# Patient Record
Sex: Female | Born: 1945 | Race: White | Hispanic: No | Marital: Married | Smoking: Former smoker
Health system: Southern US, Community
[De-identification: ages and names within clinical notes are randomized; demographics above are authoritative.]

## PROBLEM LIST (undated history)

## (undated) DIAGNOSIS — E7800 Pure hypercholesterolemia, unspecified: Secondary | ICD-10-CM

## (undated) DIAGNOSIS — D649 Anemia, unspecified: Secondary | ICD-10-CM

## (undated) DIAGNOSIS — M1990 Unspecified osteoarthritis, unspecified site: Secondary | ICD-10-CM

## (undated) DIAGNOSIS — I10 Essential (primary) hypertension: Secondary | ICD-10-CM

## (undated) DIAGNOSIS — IMO0001 Reserved for inherently not codable concepts without codable children: Secondary | ICD-10-CM

## (undated) DIAGNOSIS — Z5189 Encounter for other specified aftercare: Secondary | ICD-10-CM

## (undated) DIAGNOSIS — E079 Disorder of thyroid, unspecified: Secondary | ICD-10-CM

## (undated) HISTORY — DX: Encounter for other specified aftercare: Z51.89

## (undated) HISTORY — DX: Unspecified osteoarthritis, unspecified site: M19.90

## (undated) HISTORY — DX: Pure hypercholesterolemia, unspecified: E78.00

## (undated) HISTORY — DX: Reserved for inherently not codable concepts without codable children: IMO0001

## (undated) HISTORY — DX: Disorder of thyroid, unspecified: E07.9

## (undated) HISTORY — DX: Anemia, unspecified: D64.9

## (undated) HISTORY — PX: BREAST BIOPSY: SHX20

## (undated) HISTORY — DX: Essential (primary) hypertension: I10

---

## 2000-01-07 ENCOUNTER — Other Ambulatory Visit: Admission: RE | Admit: 2000-01-07 | Discharge: 2000-01-07 | Payer: Self-pay | Admitting: Endocrinology

## 2001-01-18 ENCOUNTER — Other Ambulatory Visit: Admission: RE | Admit: 2001-01-18 | Discharge: 2001-01-18 | Payer: Self-pay | Admitting: Endocrinology

## 2002-03-10 ENCOUNTER — Other Ambulatory Visit: Admission: RE | Admit: 2002-03-10 | Discharge: 2002-03-10 | Payer: Self-pay | Admitting: Diagnostic Radiology

## 2002-05-22 ENCOUNTER — Encounter: Payer: Self-pay | Admitting: *Deleted

## 2002-05-22 HISTORY — PX: THYROIDECTOMY, PARTIAL: SHX18

## 2002-05-24 ENCOUNTER — Inpatient Hospital Stay (HOSPITAL_COMMUNITY): Admission: AD | Admit: 2002-05-24 | Discharge: 2002-05-26 | Payer: Self-pay | Admitting: *Deleted

## 2002-05-24 ENCOUNTER — Encounter (INDEPENDENT_AMBULATORY_CARE_PROVIDER_SITE_OTHER): Payer: Self-pay | Admitting: *Deleted

## 2004-07-02 ENCOUNTER — Ambulatory Visit: Payer: Self-pay | Admitting: Endocrinology

## 2004-07-08 ENCOUNTER — Ambulatory Visit: Payer: Self-pay | Admitting: Endocrinology

## 2005-07-03 ENCOUNTER — Ambulatory Visit: Payer: Self-pay | Admitting: Endocrinology

## 2005-07-10 ENCOUNTER — Ambulatory Visit: Payer: Self-pay | Admitting: Endocrinology

## 2005-07-28 ENCOUNTER — Ambulatory Visit: Payer: Self-pay | Admitting: Family Medicine

## 2006-07-12 ENCOUNTER — Ambulatory Visit: Payer: Self-pay | Admitting: Endocrinology

## 2006-07-12 LAB — CONVERTED CEMR LAB
ALT: 15 units/L (ref 0–40)
AST: 19 units/L (ref 0–37)
Albumin: 3.9 g/dL (ref 3.5–5.2)
Alkaline Phosphatase: 46 units/L (ref 39–117)
BUN: 18 mg/dL (ref 6–23)
Basophils Absolute: 0 10*3/uL (ref 0.0–0.1)
Basophils Relative: 0.4 % (ref 0.0–1.0)
Bilirubin Urine: NEGATIVE
CO2: 29 meq/L (ref 19–32)
Calcium: 9.3 mg/dL (ref 8.4–10.5)
Chloride: 103 meq/L (ref 96–112)
Cholesterol: 214 mg/dL (ref 0–200)
Creatinine, Ser: 0.9 mg/dL (ref 0.4–1.2)
Crystals: NEGATIVE
Direct LDL: 117.8 mg/dL
Eosinophils Relative: 1.2 % (ref 0.0–5.0)
GFR calc Af Amer: 82 mL/min
GFR calc non Af Amer: 68 mL/min
Glucose, Bld: 114 mg/dL — ABNORMAL HIGH (ref 70–99)
HCT: 36.9 % (ref 36.0–46.0)
HDL: 82.5 mg/dL (ref >39.0)
Hemoglobin, Urine: NEGATIVE
Hemoglobin: 12.4 g/dL (ref 12.0–15.0)
Ketones, ur: NEGATIVE mg/dL
Lymphocytes Relative: 20.7 % (ref 12.0–46.0)
MCHC: 33.7 g/dL (ref 30.0–36.0)
MCV: 94.6 fL (ref 78.0–100.0)
Monocytes Absolute: 0.4 10*3/uL (ref 0.2–0.7)
Monocytes Relative: 6.4 % (ref 3.0–11.0)
Mucus, UA: NEGATIVE
Neutro Abs: 4.7 10*3/uL (ref 1.4–7.7)
Neutrophils Relative %: 71.3 % (ref 43.0–77.0)
Nitrite: NEGATIVE
Platelets: 339 10*3/uL (ref 150–400)
Potassium: 4.1 meq/L (ref 3.5–5.1)
RBC / HPF: NONE SEEN
RBC: 3.9 M/uL (ref 3.87–5.11)
RDW: 13.5 % (ref 11.5–14.6)
Sodium: 139 meq/L (ref 135–145)
Specific Gravity, Urine: 1.025 (ref 1.000–1.03)
TSH: 0.91 microintl units/mL (ref 0.35–5.50)
Total Bilirubin: 0.6 mg/dL (ref 0.3–1.2)
Total CHOL/HDL Ratio: 2.6
Total Protein, Urine: NEGATIVE mg/dL
Total Protein: 7 g/dL (ref 6.0–8.3)
Triglycerides: 79 mg/dL (ref 0–149)
Urine Glucose: NEGATIVE mg/dL
Urobilinogen, UA: 0.2 (ref 0.0–1.0)
VLDL: 16 mg/dL (ref 0–40)
WBC: 6.6 10*3/uL (ref 4.5–10.5)
pH: 5.5 (ref 5.0–8.0)

## 2006-07-15 ENCOUNTER — Ambulatory Visit: Payer: Self-pay | Admitting: Endocrinology

## 2007-02-19 ENCOUNTER — Encounter: Payer: Self-pay | Admitting: Endocrinology

## 2007-02-19 DIAGNOSIS — J309 Allergic rhinitis, unspecified: Secondary | ICD-10-CM | POA: Insufficient documentation

## 2007-02-19 DIAGNOSIS — I10 Essential (primary) hypertension: Secondary | ICD-10-CM | POA: Insufficient documentation

## 2007-02-19 DIAGNOSIS — M1990 Unspecified osteoarthritis, unspecified site: Secondary | ICD-10-CM

## 2007-07-21 ENCOUNTER — Telehealth: Payer: Self-pay | Admitting: Endocrinology

## 2007-07-29 ENCOUNTER — Ambulatory Visit: Payer: Self-pay | Admitting: Endocrinology

## 2007-07-29 LAB — CONVERTED CEMR LAB
ALT: 17 units/L (ref 0–35)
AST: 22 units/L (ref 0–37)
Albumin: 4 g/dL (ref 3.5–5.2)
Alkaline Phosphatase: 42 units/L (ref 39–117)
BUN: 11 mg/dL (ref 6–23)
Basophils Absolute: 0 10*3/uL (ref 0.0–0.1)
Basophils Relative: 0.5 % (ref 0.0–1.0)
Bilirubin Urine: NEGATIVE
Bilirubin, Direct: 0.1 mg/dL (ref 0.0–0.3)
CO2: 32 meq/L (ref 19–32)
Calcium: 9.6 mg/dL (ref 8.4–10.5)
Chloride: 102 meq/L (ref 96–112)
Cholesterol: 184 mg/dL (ref 0–200)
Creatinine, Ser: 0.7 mg/dL (ref 0.4–1.2)
Eosinophils Absolute: 0.1 10*3/uL (ref 0.0–0.6)
Eosinophils Relative: 1.1 % (ref 0.0–5.0)
GFR calc Af Amer: 109 mL/min
GFR calc non Af Amer: 90 mL/min
Glucose, Bld: 116 mg/dL — ABNORMAL HIGH (ref 70–99)
HCT: 35.3 % — ABNORMAL LOW (ref 36.0–46.0)
HDL: 84.2 mg/dL (ref >39.0)
Hemoglobin, Urine: NEGATIVE
Hemoglobin: 11.7 g/dL — ABNORMAL LOW (ref 12.0–15.0)
Hgb A1c MFr Bld: 6 % (ref 4.6–6.0)
Ketones, ur: NEGATIVE mg/dL
LDL Cholesterol: 86 mg/dL (ref 0–99)
Leukocytes, UA: NEGATIVE
Lymphocytes Relative: 21.5 % (ref 12.0–46.0)
MCHC: 33.3 g/dL (ref 30.0–36.0)
MCV: 94.8 fL (ref 78.0–100.0)
Monocytes Absolute: 0.5 10*3/uL (ref 0.2–0.7)
Monocytes Relative: 8 % (ref 3.0–11.0)
Neutro Abs: 4 10*3/uL (ref 1.4–7.7)
Neutrophils Relative %: 68.9 % (ref 43.0–77.0)
Nitrite: NEGATIVE
Platelets: 314 10*3/uL (ref 150–400)
Potassium: 3.7 meq/L (ref 3.5–5.1)
RBC: 3.72 M/uL — ABNORMAL LOW (ref 3.87–5.11)
RDW: 13.9 % (ref 11.5–14.6)
Sed Rate: 22 mm/hr (ref 0–25)
Sodium: 141 meq/L (ref 135–145)
Specific Gravity, Urine: 1.02 (ref 1.000–1.03)
TSH: 1.07 microintl units/mL (ref 0.35–5.50)
Total Bilirubin: 0.6 mg/dL (ref 0.3–1.2)
Total CHOL/HDL Ratio: 2.2
Total Protein, Urine: NEGATIVE mg/dL
Total Protein: 6.8 g/dL (ref 6.0–8.3)
Triglycerides: 69 mg/dL (ref 0–149)
Urine Glucose: NEGATIVE mg/dL
Urobilinogen, UA: 0.2 (ref 0.0–1.0)
VLDL: 14 mg/dL (ref 0–40)
WBC: 5.9 10*3/uL (ref 4.5–10.5)
pH: 6 (ref 5.0–8.0)

## 2007-08-03 ENCOUNTER — Ambulatory Visit: Payer: Self-pay | Admitting: Endocrinology

## 2007-08-03 DIAGNOSIS — R9431 Abnormal electrocardiogram [ECG] [EKG]: Secondary | ICD-10-CM

## 2007-08-03 DIAGNOSIS — D649 Anemia, unspecified: Secondary | ICD-10-CM

## 2007-08-09 ENCOUNTER — Telehealth (INDEPENDENT_AMBULATORY_CARE_PROVIDER_SITE_OTHER): Payer: Self-pay | Admitting: *Deleted

## 2007-08-15 ENCOUNTER — Encounter: Payer: Self-pay | Admitting: Endocrinology

## 2007-08-15 ENCOUNTER — Ambulatory Visit: Payer: Self-pay

## 2007-08-15 ENCOUNTER — Telehealth (INDEPENDENT_AMBULATORY_CARE_PROVIDER_SITE_OTHER): Payer: Self-pay | Admitting: *Deleted

## 2008-01-12 ENCOUNTER — Encounter: Payer: Self-pay | Admitting: Endocrinology

## 2008-05-22 HISTORY — PX: OTHER SURGICAL HISTORY: SHX169

## 2008-07-30 ENCOUNTER — Ambulatory Visit: Payer: Self-pay | Admitting: Endocrinology

## 2008-08-01 LAB — CONVERTED CEMR LAB
ALT: 14 units/L (ref 0–35)
AST: 21 units/L (ref 0–37)
Albumin: 4 g/dL (ref 3.5–5.2)
Alkaline Phosphatase: 42 units/L (ref 39–117)
BUN: 15 mg/dL (ref 6–23)
Basophils Absolute: 0 10*3/uL (ref 0.0–0.1)
Basophils Relative: 0.6 % (ref 0.0–3.0)
Bilirubin Urine: NEGATIVE
Bilirubin, Direct: 0.1 mg/dL (ref 0.0–0.3)
CO2: 30 meq/L (ref 19–32)
Calcium: 9.2 mg/dL (ref 8.4–10.5)
Chloride: 102 meq/L (ref 96–112)
Cholesterol: 179 mg/dL (ref 0–200)
Creatinine, Ser: 0.7 mg/dL (ref 0.4–1.2)
Eosinophils Absolute: 0 10*3/uL (ref 0.0–0.7)
Eosinophils Relative: 0.7 % (ref 0.0–5.0)
GFR calc Af Amer: 109 mL/min
GFR calc non Af Amer: 90 mL/min
Glucose, Bld: 110 mg/dL — ABNORMAL HIGH (ref 70–99)
HCT: 35.5 % — ABNORMAL LOW (ref 36.0–46.0)
HDL: 89.2 mg/dL (ref >39.0)
Hemoglobin, Urine: NEGATIVE
Hemoglobin: 12.2 g/dL (ref 12.0–15.0)
Ketones, ur: NEGATIVE mg/dL
LDL Cholesterol: 79 mg/dL (ref 0–99)
Leukocytes, UA: NEGATIVE
Lymphocytes Relative: 16.8 % (ref 12.0–46.0)
MCHC: 34.5 g/dL (ref 30.0–36.0)
MCV: 94.9 fL (ref 78.0–100.0)
Monocytes Absolute: 0.5 10*3/uL (ref 0.1–1.0)
Monocytes Relative: 7 % (ref 3.0–12.0)
Neutro Abs: 5.1 10*3/uL (ref 1.4–7.7)
Neutrophils Relative %: 74.9 % (ref 43.0–77.0)
Nitrite: NEGATIVE
Platelets: 282 10*3/uL (ref 150–400)
Potassium: 3.6 meq/L (ref 3.5–5.1)
RBC: 3.74 M/uL — ABNORMAL LOW (ref 3.87–5.11)
RDW: 14.9 % — ABNORMAL HIGH (ref 11.5–14.6)
Sodium: 140 meq/L (ref 135–145)
Specific Gravity, Urine: 1.025 (ref 1.000–1.03)
TSH: 1.03 microintl units/mL (ref 0.35–5.50)
Total Bilirubin: 0.6 mg/dL (ref 0.3–1.2)
Total CHOL/HDL Ratio: 2
Total Protein, Urine: NEGATIVE mg/dL
Total Protein: 6.7 g/dL (ref 6.0–8.3)
Triglycerides: 54 mg/dL (ref 0–149)
Urine Glucose: NEGATIVE mg/dL
Urobilinogen, UA: 0.2 (ref 0.0–1.0)
VLDL: 11 mg/dL (ref 0–40)
WBC: 6.7 10*3/uL (ref 4.5–10.5)
pH: 6 (ref 5.0–8.0)

## 2008-08-03 ENCOUNTER — Ambulatory Visit: Payer: Self-pay | Admitting: Endocrinology

## 2008-08-03 DIAGNOSIS — Z780 Asymptomatic menopausal state: Secondary | ICD-10-CM

## 2008-08-09 ENCOUNTER — Encounter: Payer: Self-pay | Admitting: Endocrinology

## 2008-08-09 ENCOUNTER — Ambulatory Visit: Payer: Self-pay | Admitting: Family Medicine

## 2008-08-24 ENCOUNTER — Telehealth: Payer: Self-pay | Admitting: Endocrinology

## 2009-05-09 ENCOUNTER — Encounter: Payer: Self-pay | Admitting: Endocrinology

## 2009-07-30 ENCOUNTER — Ambulatory Visit: Payer: Self-pay | Admitting: Endocrinology

## 2009-07-31 LAB — CONVERTED CEMR LAB
ALT: 14 units/L (ref 0–35)
AST: 19 units/L (ref 0–37)
Albumin: 4 g/dL (ref 3.5–5.2)
Alkaline Phosphatase: 41 units/L (ref 39–117)
BUN: 17 mg/dL (ref 6–23)
Basophils Absolute: 0 10*3/uL (ref 0.0–0.1)
Basophils Relative: 0.6 % (ref 0.0–3.0)
Bilirubin, Direct: 0.1 mg/dL (ref 0.0–0.3)
CO2: 29 meq/L (ref 19–32)
Calcium: 9 mg/dL (ref 8.4–10.5)
Chloride: 109 meq/L (ref 96–112)
Cholesterol: 199 mg/dL (ref 0–200)
Creatinine, Ser: 0.7 mg/dL (ref 0.4–1.2)
Eosinophils Absolute: 0.1 10*3/uL (ref 0.0–0.7)
Eosinophils Relative: 1.5 % (ref 0.0–5.0)
GFR calc non Af Amer: 89.32 mL/min (ref >60)
Glucose, Bld: 102 mg/dL — ABNORMAL HIGH (ref 70–99)
HCT: 34.9 % — ABNORMAL LOW (ref 36.0–46.0)
HDL: 96.5 mg/dL (ref >39.00)
Hemoglobin, Urine: NEGATIVE
Hemoglobin: 11.6 g/dL — ABNORMAL LOW (ref 12.0–15.0)
Ketones, ur: NEGATIVE mg/dL
LDL Cholesterol: 88 mg/dL (ref 0–99)
Leukocytes, UA: NEGATIVE
Lymphocytes Relative: 31.2 % (ref 12.0–46.0)
Lymphs Abs: 1.4 10*3/uL (ref 0.7–4.0)
MCHC: 33.3 g/dL (ref 30.0–36.0)
MCV: 98.5 fL (ref 78.0–100.0)
Monocytes Absolute: 0.3 10*3/uL (ref 0.1–1.0)
Monocytes Relative: 7.1 % (ref 3.0–12.0)
Neutro Abs: 2.8 10*3/uL (ref 1.4–7.7)
Neutrophils Relative %: 59.6 % (ref 43.0–77.0)
Nitrite: NEGATIVE
Platelets: 251 10*3/uL (ref 150.0–400.0)
Potassium: 3.8 meq/L (ref 3.5–5.1)
RBC: 3.55 M/uL — ABNORMAL LOW (ref 3.87–5.11)
RDW: 13.8 % (ref 11.5–14.6)
Sodium: 145 meq/L (ref 135–145)
Specific Gravity, Urine: >=1.03 (ref 1.000–1.030)
TSH: 0.77 microintl units/mL (ref 0.35–5.50)
Total Bilirubin: 0.6 mg/dL (ref 0.3–1.2)
Total CHOL/HDL Ratio: 2
Total Protein, Urine: NEGATIVE mg/dL
Total Protein: 6.6 g/dL (ref 6.0–8.3)
Triglycerides: 72 mg/dL (ref 0.0–149.0)
Urine Glucose: NEGATIVE mg/dL
Urobilinogen, UA: 0.2 (ref 0.0–1.0)
VLDL: 14.4 mg/dL (ref 0.0–40.0)
WBC: 4.6 10*3/uL (ref 4.5–10.5)
pH: 6 (ref 5.0–8.0)

## 2009-08-05 ENCOUNTER — Ambulatory Visit: Payer: Self-pay | Admitting: Endocrinology

## 2009-09-25 ENCOUNTER — Telehealth: Payer: Self-pay | Admitting: Endocrinology

## 2009-12-19 ENCOUNTER — Encounter (INDEPENDENT_AMBULATORY_CARE_PROVIDER_SITE_OTHER): Payer: Self-pay | Admitting: *Deleted

## 2010-07-20 LAB — CONVERTED CEMR LAB
Basophils Absolute: 0 10*3/uL (ref 0.0–0.1)
Basophils Relative: 0.3 % (ref 0.0–1.0)
Creatinine,U: 27.4 mg/dL
Eosinophils Absolute: 0 10*3/uL (ref 0.0–0.6)
Eosinophils Relative: 0.7 % (ref 0.0–5.0)
Folate: >20 ng/mL
HCT: 35.8 % — ABNORMAL LOW (ref 36.0–46.0)
Hemoglobin: 12.2 g/dL (ref 12.0–15.0)
Hgb A1c MFr Bld: 6 % (ref 4.6–6.0)
Iron: 67 ug/dL (ref 42–145)
Lymphocytes Relative: 19 % (ref 12.0–46.0)
MCHC: 34 g/dL (ref 30.0–36.0)
MCV: 94.7 fL (ref 78.0–100.0)
Microalb Creat Ratio: 29.2 mg/g (ref 0.0–30.0)
Microalb, Ur: 0.8 mg/dL (ref 0.0–1.9)
Monocytes Absolute: 0.5 10*3/uL (ref 0.2–0.7)
Monocytes Relative: 7.7 % (ref 3.0–11.0)
Neutro Abs: 5 10*3/uL (ref 1.4–7.7)
Neutrophils Relative %: 72.3 % (ref 43.0–77.0)
Platelets: 319 10*3/uL (ref 150–400)
RBC: 3.78 M/uL — ABNORMAL LOW (ref 3.87–5.11)
RDW: 14.3 % (ref 11.5–14.6)
Saturation Ratios: 16.1 % — ABNORMAL LOW (ref 20.0–50.0)
Transferrin: 296.8 mg/dL (ref >=212.0)
Vitamin B-12: 369 pg/mL (ref 211–911)
WBC: 6.8 10*3/uL (ref 4.5–10.5)

## 2010-07-22 NOTE — Assessment & Plan Note (Signed)
Summary: PHYSICAL---STC   Vital Signs:  Patient profile:   65 year old female Height:      63.5 inches (161.29 cm) Weight:      157.13 pounds (71.42 kg) BMI:     27.50 O2 Sat:      97 % on Room air Temp:     97.3 degrees F (36.28 degrees C) oral Pulse rate:   74 / minute BP sitting:   130 / 72  (left arm) Cuff size:   regular  Vitals Entered By: Josph Macho CMA (August 05, 2009 11:10 AM)  O2 Flow:  Room air CC: Physical/ CF Is Patient Diabetic? Yes   CC:  Physical/ CF.  History of Present Illness: here for regular wellness examination.  she's feeling pretty well in general, and does not smoke.   Current Medications (verified): 1)  Evista 60 Mg  Tabs (Raloxifene Hcl) .... Take 1 By Mouth Qd 2)  Actos 15 Mg  Tabs (Pioglitazone Hcl) .... Take 1 By Mouth Qd 3)  Zestril 40 Mg  Tabs (Lisinopril) .... Take 1 By Mouth Qd 4)  Levoxyl 75 Mcg  Tabs (Levothyroxine Sodium) .... Takem 1 By Mouth Qd 5)  Lovastatin 40 Mg  Tabs (Lovastatin) .... Take 2 By Mouth Qd 6)  Coreg 6.25 Mg  Tabs (Carvedilol) .... Bid 7)  Hyzaar 100-25 Mg Tabs (Losartan Potassium-Hctz) .... Qd 8)  Amlodipine Besylate 2.5 Mg Tabs (Amlodipine Besylate) .... Qd  Allergies (verified): 1)  ! Erythromycin 2)  ! Demerol  Past History:  Past Medical History: Last updated: 08/03/2008 Menopause Hyperlipidemia Leukopenia NONSPECIFIC ABNORMAL ELECTROCARDIOGRAM (ICD-794.31) UNSPECIFIED ANEMIA (ICD-285.9) ROUTINE GENERAL MEDICAL EXAM@HEALTH  CARE FACL (ICD-V70.0) OSTEOARTHRITIS (ICD-715.90) HYPERTENSION (ICD-401.9) DIABETES MELLITUS, TYPE II (ICD-250.00) ALLERGIC RHINITIS (ICD-477.9)  Family History: Reviewed history from 08/03/2007 and no changes required. no cancer  Social History: Reviewed history from 08/03/2007 and no changes required. 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         she has lost a few lbs, due to her efforts  Physical Exam  General:  normal appearance.   Head:  head: no deformity eyes: no periorbital swelling, no proptosis external nose and ears are normal mouth: no lesion seen Neck:  a healed scar is present.  i do not appreciate a nodule in the thyroid or elsewhere in the neck  Breasts:  sees gyn  Lungs:  Clear to auscultation bilaterally. Normal respiratory effort.  Heart:  Regular rate and rhythm without murmurs or gallops noted. Normal S1,S2.   Abdomen:  abdomen is soft, nontender.  no hepatosplenomegaly.   not distended.  no hernia  Rectal:  sees gyn  Genitalia:  sees gyn  Msk:  muscle bulk and strength are grossly normal.  no obvious joint swelling.  gait is normal and steady  Pulses:  dorsalis pedis intact bilat.  no carotid bruit  Extremities:  no deformity.  no ulcer on the feet.  feet are of normal color and temp.  no edema  Neurologic:  sensation is intact to touch on the feet  Skin:  normal texture and temp.  no rash, except for mild seborrhea on the face.  not diaphoretic  Cervical Nodes:  No significant adenopathy.  Psych:  Alert and cooperative; normal mood and affect; normal attention span and concentration.  Impression & Recommendations:  Problem # 1:  ROUTINE GENERAL MEDICAL EXAM@HEALTH  CARE FACL (ICD-V70.0)  Medications Added to Medication List This Visit: 1)  Screening Mammography   Other Orders: EKG w/ Interpretation (93000) Tdap => 37yrs IM ([address]) Pneumococcal Vaccine (60[address]) Admin 1st Vaccine (09811) Admin of Any Addtl Vaccine (91478) Gastroenterology Referral (GI) Est. Patient 40-64 years [phone]  Preventive Care Screening     sees gyn   Patient Instructions: 1)  wash your face once daily with selsun blue shampoo.  call if no help, to consider  antiobiotic gel. 2)  Please schedule a follow-up appointment in 6 months. Prescriptions: SCREENING MAMMOGRAPHY   #0 x 0   Entered and Authorized by:   Minus Breeding MD   Signed by:   Minus Breeding MD on 08/05/2009   Method used:   Print then Give to Patient   RxID:   [id number] HYZAAR 100-25 MG TABS (LOSARTAN POTASSIUM-HCTZ) qd  #30 x 11   Entered and Authorized by:   Minus Breeding MD   Signed by:   Minus Breeding MD on 08/05/2009   Method used:   Electronically to        [facility] [phone]* (retail)       [address] [place], [place]  [address]       Ph: [phone]       Fax: [phone]   RxID:   [id number] COREG 6.25 MG  TABS (CARVEDILOL) bid  #60 x 11   Entered and Authorized by:   Minus Breeding MD   Signed by:   Minus Breeding MD on 08/05/2009   Method used:   Electronically to        [facility] [phone]* (retail)       [address] [place], [place]  [address]       Ph: [phone]       Fax: [phone]   RxID:   [id number] AMLODIPINE BESYLATE 2.5 MG TABS (AMLODIPINE BESYLATE) qd  #30 x 11   Entered and Authorized by:   Minus Breeding MD   Signed by:   Minus Breeding MD on 08/05/2009   Method used:   Electronically to        [facility] [phone]* (retail)       [address] [place], [place]  [address]       Ph: [phone]       Fax: [phone]   RxID:   [id number] LOVASTATIN 40 MG  TABS (LOVASTATIN) take 2 by mouth qd  #60 x 11   Entered and Authorized by:   Minus Breeding MD   Signed by:   Minus Breeding MD on 08/05/2009   Method used:   Electronically to        [facility] [phone]* (retail)       [address] [place], [place]  [address]       Ph: [phone]       Fax: [phone]   RxID:   [id number] LEVOXYL 75 MCG  TABS (LEVOTHYROXINE SODIUM) takem 1 by mouth qd  #30 x 11   Entered and Authorized by:   Minus Breeding MD   Signed by:   Minus Breeding MD on 08/05/2009  Method used:    Electronically to        [facility] [phone]* (retail)       [address] [place], [place]  [address]       Ph: [phone]       Fax: [phone]   RxID:   [id number] ZESTRIL 40 MG  TABS (LISINOPRIL) take 1 by mouth qd  #30 x 11   Entered and Authorized by:   Minus Breeding MD   Signed by:   Minus Breeding MD on 08/05/2009   Method used:   Electronically to        [facility] [phone]* (retail)       [address] [place], [place]  [address]       Ph: [phone]       Fax: [phone]   RxID:   [id number] ACTOS 15 MG  TABS (PIOGLITAZONE HCL) take 1 by mouth qd  #30 x 11   Entered and Authorized by:   Minus Breeding MD   Signed by:   Minus Breeding MD on 08/05/2009   Method used:   Electronically to        [facility] [phone]* (retail)       [address] [place], [place]  [address]       Ph: [phone]       Fax: [phone]   RxID:   [id number] EVISTA 60 MG  TABS (RALOXIFENE HCL) take 1 by mouth qd  #30 x 11   Entered and Authorized by:   Minus Breeding MD   Signed by:   Minus Breeding MD on 08/05/2009   Method used:   Electronically to        [facility] [phone]* (retail)       [address] [place], [place]  [address]       Ph: [phone]       Fax: [phone]   RxID:   [id number]    Preventive Care Screening     sees gyn    Immunizations Administered:  Tetanus Vaccine:    Vaccine Type: Tdap    Site: left deltoid    Mfr: Sanofi Pasteur    Dose: 0.5 ml    Route: IM    Given by: Josph Macho RMA    Exp. Date: 05/07/2011    Lot #: [id number]    VIS given: 05/10/07 version given August 05, 2009.  Pneumonia Vaccine:    Vaccine Type: Pneumovax    Site: right deltoid    Mfr: Merck    Dose: 0.5 ml    Route: IM    Given by: Josph Macho RMA    Exp. Date: 07/18/2010    Lot #: [id number]    VIS given: 01/18/96 version given August 05, 2009.    Bone Density  Procedure date:   08/06/2005  Findings:      08/06/2005- Overall the patient's bone density test results were normal. Lumbar spine was normal Proximal femur was normal

## 2010-07-22 NOTE — Letter (Signed)
Summary: [facility] Referral (unable to schedule) Notification  [facility]  [address] [place], [place] [address]   Phone: [phone]  Fax: [phone]      December 19, 2009 [name] September 05, 1945 MRN: [id number]   [name] [address] [place], [place]  [address]   Dear Dr. Everardo All:   Thank you for your kind referral of the above patient. We have attempted to schedule the recommended Colonoscopy but have been unable to schedule because:  _X_ The patient was not available by phone and/or has not returned our calls.  __ The patient declined to schedule the procedure at this time.  We appreciate the referral and hope that we will have the opportunity to treat this patient in the future.    Sincerely,   [facility]  Vania Rea. Jarold Motto M.D. Hedwig Morton. Juanda Chance M.D. Venita Lick. Russella Dar M.D. Wilhemina Bonito. Marina Goodell M.D. Barbette Hair. Arlyce Dice M.D. Iva Boop M.D. Cheron Every.D.

## 2010-07-22 NOTE — Progress Notes (Signed)
Summary: Rx requesting   Phone Note Call from Patient Call back at Home Phone [phone]   Caller: Patient Summary of Call: pt called stating that she is now enrolled in [facility]. Pt is requesting Rx for all medications to go to [facility] mail order pharmacy Initial call taken by: Margaret Pyle, CMA,  September 25, 2009 1:01 PM    Prescriptions: AMLODIPINE BESYLATE 2.5 MG TABS (AMLODIPINE BESYLATE) qd  #90 x 3   Entered by:   Margaret Pyle, CMA   Authorized by:   Minus Breeding MD   Signed by:   Margaret Pyle, CMA on 09/25/2009   Method used:   Electronically to        [facility]* (mail-order)       [address]       [place], [place]  [address]       Ph: [phone]       Fax: [phone]   RxID:   [id number] HYZAAR 100-25 MG TABS (LOSARTAN POTASSIUM-HCTZ) qd  #90 x 3   Entered by:   Margaret Pyle, CMA   Authorized by:   Minus Breeding MD   Signed by:   Margaret Pyle, CMA on 09/25/2009   Method used:   Electronically to        [facility]* (mail-order)       [address] [place]  [address]       Ph: [phone]       Fax: [phone]   RxID:   [id number] COREG 6.25 MG  TABS (CARVEDILOL) bid  #180 x 3   Entered by:   Margaret Pyle, CMA   Authorized by:   Minus Breeding MD   Signed by:   Margaret Pyle, CMA on 09/25/2009   Method used:   Electronically to        [facility]* (mail-order)       [address]       [place], [place]  [address]       Ph: [phone]       Fax: [phone]   RxID:   [id number] LOVASTATIN 40 MG  TABS (LOVASTATIN) take 2 by mouth qd  #180 x 3   Entered by:   Margaret Pyle, CMA   Authorized by:   Minus Breeding MD   Signed by:   Margaret Pyle, CMA on 09/25/2009   Method used:   Electronically to        [facility]* (mail-order)     [address]       [place], [place]  [address]       Ph: [phone]       Fax: [phone]   RxID:   [id number] LEVOXYL 75 MCG  TABS (LEVOTHYROXINE SODIUM) takem 1 by mouth qd  #90 x 3   Entered by:   Margaret Pyle, CMA   Authorized by:   Minus Breeding MD   Signed by:   Margaret Pyle, CMA on 09/25/2009   Method used:   Electronically to        [facility]* (mail-order)       [address] [place] [place]  [address]       Ph: [phone]       Fax: [phone]   RxID:   [id number] ZESTRIL 40 MG  TABS (LISINOPRIL) take 1 by  mouth qd  #90 x 3   Entered by:   Margaret Pyle, CMA   Authorized by:   Minus Breeding MD   Signed by:   Margaret Pyle, CMA on 09/25/2009   Method used:   Electronically to        [facility]* (mail-order)       [address]       [place], [place]  [address]       Ph: [phone]       Fax: [phone]   RxID:   [id number] ACTOS 15 MG  TABS (PIOGLITAZONE HCL) take 1 by mouth qd  #90 x 3   Entered by:   Margaret Pyle, CMA   Authorized by:   Minus Breeding MD   Signed by:   Margaret Pyle, CMA on 09/25/2009   Method used:   Electronically to        [facility]* (mail-order)       [address]       [place], [place]  [address]       Ph: [phone]       Fax: [phone]   RxID:   [id number] EVISTA 60 MG  TABS (RALOXIFENE HCL) take 1 by mouth qd  #90 x 3   Entered by:   Margaret Pyle, CMA   Authorized by:   Minus Breeding MD   Signed by:   Margaret Pyle, CMA on 09/25/2009   Method used:   Electronically to        [facility]* (mail-order)       [address], [place]  [address]       Ph: [phone]       Fax: [phone]   RxID:   [id number]

## 2010-08-11 ENCOUNTER — Encounter (INDEPENDENT_AMBULATORY_CARE_PROVIDER_SITE_OTHER): Payer: Medicare Other | Admitting: Endocrinology

## 2010-08-11 ENCOUNTER — Encounter: Payer: Self-pay | Admitting: Endocrinology

## 2010-08-11 ENCOUNTER — Other Ambulatory Visit: Payer: Medicare Other

## 2010-08-11 ENCOUNTER — Encounter (INDEPENDENT_AMBULATORY_CARE_PROVIDER_SITE_OTHER): Payer: Self-pay | Admitting: *Deleted

## 2010-08-11 ENCOUNTER — Other Ambulatory Visit: Payer: Self-pay | Admitting: Endocrinology

## 2010-08-11 DIAGNOSIS — Z79899 Other long term (current) drug therapy: Secondary | ICD-10-CM

## 2010-08-11 DIAGNOSIS — Z0000 Encounter for general adult medical examination without abnormal findings: Secondary | ICD-10-CM

## 2010-08-11 DIAGNOSIS — E785 Hyperlipidemia, unspecified: Secondary | ICD-10-CM

## 2010-08-11 DIAGNOSIS — E119 Type 2 diabetes mellitus without complications: Secondary | ICD-10-CM

## 2010-08-11 DIAGNOSIS — I10 Essential (primary) hypertension: Secondary | ICD-10-CM

## 2010-08-11 DIAGNOSIS — E7800 Pure hypercholesterolemia, unspecified: Secondary | ICD-10-CM

## 2010-08-11 DIAGNOSIS — D649 Anemia, unspecified: Secondary | ICD-10-CM

## 2010-08-11 LAB — BASIC METABOLIC PANEL
Calcium: 9.4 mg/dL (ref 8.4–10.5)
Creatinine, Ser: 0.6 mg/dL (ref 0.4–1.2)
GFR: 100.54 mL/min (ref >60.00)
Sodium: 141 mEq/L (ref 135–145)

## 2010-08-11 LAB — CBC WITH DIFFERENTIAL/PLATELET
Basophils Absolute: 0 10*3/uL (ref 0.0–0.1)
Basophils Relative: 0.3 % (ref 0.0–3.0)
Hemoglobin: 12.6 g/dL (ref 12.0–15.0)
Lymphocytes Relative: 19.3 % (ref 12.0–46.0)
Monocytes Relative: 6.7 % (ref 3.0–12.0)
Neutro Abs: 5.1 10*3/uL (ref 1.4–7.7)
Neutrophils Relative %: 72.3 % (ref 43.0–77.0)
RBC: 3.72 Mil/uL — ABNORMAL LOW (ref 3.87–5.11)
RDW: 14 % (ref 11.5–14.6)

## 2010-08-11 LAB — URINALYSIS, ROUTINE W REFLEX MICROSCOPIC
Bilirubin Urine: NEGATIVE
Ketones, ur: NEGATIVE
Leukocytes, UA: NEGATIVE
Nitrite: NEGATIVE
Specific Gravity, Urine: 1.02 (ref 1.000–1.030)
Urobilinogen, UA: 0.2 (ref 0.0–1.0)
pH: 6 (ref 5.0–8.0)

## 2010-08-11 LAB — HEPATIC FUNCTION PANEL
AST: 19 U/L (ref 0–37)
Albumin: 4.2 g/dL (ref 3.5–5.2)
Alkaline Phosphatase: 45 U/L (ref 39–117)
Bilirubin, Direct: 0.1 mg/dL (ref 0.0–0.3)

## 2010-08-11 LAB — MICROALBUMIN / CREATININE URINE RATIO
Creatinine,U: 168.2 mg/dL
Microalb Creat Ratio: 0.6 mg/g (ref 0.0–30.0)

## 2010-08-11 LAB — LIPID PANEL
HDL: 98 mg/dL (ref >39.00)
Total CHOL/HDL Ratio: 2
Triglycerides: 61 mg/dL (ref 0.0–149.0)

## 2010-08-11 LAB — IBC PANEL
Saturation Ratios: 19.5 % — ABNORMAL LOW (ref 20.0–50.0)
Transferrin: 274.5 mg/dL (ref 212.0–360.0)

## 2010-08-11 LAB — LDL CHOLESTEROL, DIRECT: Direct LDL: 96.1 mg/dL

## 2010-08-15 ENCOUNTER — Encounter (INDEPENDENT_AMBULATORY_CARE_PROVIDER_SITE_OTHER): Payer: Self-pay | Admitting: *Deleted

## 2010-08-19 NOTE — Assessment & Plan Note (Signed)
Summary: yearly medicare physical-lb   Vital Signs:  Patient profile:   65 year old female Height:      63.5 inches (161.29 cm) Weight:      162.38 pounds (73.81 kg) BMI:     28.42 O2 Sat:      97 % on Room air Temp:     99.0 degrees F (37.22 degrees C) oral Pulse rate:   73 / minute Pulse rhythm:   regular BP sitting:   114 / 72  (left arm) Cuff size:   large  Vitals Entered By: Brenton Grills CMA (AAMA) (August 11, 2010 8:30 AM)  O2 Flow:  Room air CC: Medicare Wellness/aj Is Patient Diabetic? Yes Comments pt is due for mammogram and colonoscopy and may be due for pap   CC:  Medicare Wellness/aj.  History of Present Illness: pt is here for medicare welllness visit.  she denies memory loss and depression.  she says she is able to perform activities of daily living without assistance.  she has no limitations to physical activity.  she stopped actos 3 mos ago.   Current Medications (verified): 1)  Evista 60 Mg  Tabs (Raloxifene Hcl) .... Take 1 By Mouth Qd 2)  Actos 15 Mg  Tabs (Pioglitazone Hcl) .... Take 1 By Mouth Qd 3)  Zestril 40 Mg  Tabs (Lisinopril) .... Take 1 By Mouth Qd 4)  Levoxyl 75 Mcg  Tabs (Levothyroxine Sodium) .... Takem 1 By Mouth Qd 5)  Lovastatin 40 Mg  Tabs (Lovastatin) .... Take 2 By Mouth Qd 6)  Coreg 6.25 Mg  Tabs (Carvedilol) .... Bid 7)  Hyzaar 100-25 Mg Tabs (Losartan Potassium-Hctz) .... Qd 8)  Amlodipine Besylate 2.5 Mg Tabs (Amlodipine Besylate) .... Qd 9)  Screening Mammography  Allergies (verified): 1)  ! Erythromycin 2)  ! Demerol  Past History:  Past Medical History: Menopause Leukopenia HYPERCHOLESTEROLEMIA (ICD-272.0) LONG-TERM (CURRENT) USE OF OTHER MEDICATIONS (ICD-V58.69) ASYMPTOMATIC POSTMENOPAUSAL STATUS (ICD-V49.81) NONSPECIFIC ABNORMAL ELECTROCARDIOGRAM (ICD-794.31) UNSPECIFIED ANEMIA (ICD-285.9) ROUTINE GENERAL MEDICAL EXAM@HEALTH  CARE FACL (ICD-V70.0) OSTEOARTHRITIS (ICD-715.90) HYPERTENSION (ICD-401.9) DIABETES  MELLITUS, TYPE II (ICD-250.00) ALLERGIC RHINITIS (ICD-477.9)  ortho: dr curl ([facility]) gyn: not sure  Family History: Reviewed history from 08/03/2007 and no changes required. mother has colon cancer  Social History: Reviewed history from 08/05/2009 and no changes required. 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    Physical Exam  General:  normal appearance.   Eyes:  (sees opthal) Ears:  grossly normal hearing.   Lungs:  Clear to auscultation bilaterally. Normal respiratory effort.  Heart:  Regular rate and rhythm without murmurs or gallops noted. Normal S1,S2.   Msk:  pt easily and quickly performs "get-up-and-go" from a sitting position  Pulses:  dorsalis pedis intact bilat.  no carotid bruit  Extremities:  no deformity.  no ulcer on the feet.  feet are of normal color and temp.  no edema  Neurologic:  cn 2-12 grossly intact.   readily moves all 4's.   sensation is intact to touch on the feet Psych:  remembers 3/3 at 5 minutes.  excellent recall.  can easily read and write a sentence.  alert and oriented x 3.    Impression & Recommendations:  Problem # 1:  ROUTINE GENERAL MEDICAL EXAM@HEALTH  CARE FACL (ICD-V70.0)  Medications Added to Medication List  This Visit: 1)  Transderm-scop 1.5 Mg Pt72 (Scopolamine base) .Marland Kitchen.. 1 every 3 days  Other Orders: EKG w/ Interpretation (93[address]) Gastroenterology Referral (GI) TLB-Lipid Panel (80061-LIPID) TLB-BMP (Basic Metabolic Panel-BMET) (80048-METABOL) TLB-CBC Platelet - w/Differential (85025-CBCD) TLB-Hepatic/Liver Function Pnl (80076-HEPATIC) TLB-TSH (Thyroid Stimulating Hormone) (84443-TSH) TLB-A1C / Hgb A1C (Glycohemoglobin) (83036-A1C) TLB-Microalbumin/Creat Ratio, Urine (82043-MALB) TLB-Udip w/ Micro  (81001-URINE) TLB-IBC Pnl (Iron/FE;Transferrin) (83550-IBC) TLB-B12, Serum-Total ONLY (04540-J81) MC -Subsequent Annual Wellness Visit [phone])  Preventive Care Screening  Last Flu Shot:    Date:  03/24/2010    Results:  given      gyn is in [place], who does mammogram, pap, and dexa   Patient Instructions: 1)  please consider these measures for your health:  minimize alcohol.  do not use tobacco products.  have a colonoscopy at least every 10 years from age 52.  keep firearms safely stored.  always use seat belts.  have working smoke alarms in your home.  see an eye doctor and dentist regularly.  never drive under the influence of alcohol or drugs (including prescription drugs).  those with fair skin should take precautions against the sun. 2)  please let me know what your wishes would be, if artificial life support measures should become necessary.  it is critically important to prevent falling down (keep floor areas well-lit, dry, and free of loose objects) 3)  here is a list of medicare-covered preventive services 4)  you will be called with a day and time for an appointment for a colonoscopy. 5)  blood tests are being ordered for you today.  please call [phone] to hear your test results. 6)  Please schedule a follow-up appointment in 1 year. Prescriptions: AMLODIPINE BESYLATE 2.5 MG TABS (AMLODIPINE BESYLATE) qd  #90 x 3   Entered and Authorized by:   Minus Breeding MD   Signed by:   Minus Breeding MD on 08/11/2010   Method used:   Electronically to        [facility]* (mail-order)       [address]       [place], [place]  [address]       Ph: [phone]       Fax: [phone]   RxID:   [id number] HYZAAR 100-25 MG TABS (LOSARTAN POTASSIUM-HCTZ) qd  #90 x 3   Entered and Authorized by:   Minus Breeding MD   Signed by:   Minus Breeding MD on 08/11/2010   Method used:   Electronically to        [facility]* (mail-order)        [address]       [place], [place]  [address]       Ph: [phone]       Fax: [phone]   RxID:   [id number] COREG 6.25 MG  TABS (CARVEDILOL) bid  #180 x 3   Entered and Authorized by:   Minus Breeding MD   Signed by:   Minus Breeding MD on 08/11/2010   Method used:   Electronically to        [facility]* (mail-order)       [address]       [place], [place]  [address]       Ph: [phone]       Fax: [phone]   RxID:   [id number] LOVASTATIN 40 MG  TABS (LOVASTATIN) take 2 by mouth qd  #180 x 3  Entered and Authorized by:   Minus Breeding MD   Signed by:   Minus Breeding MD on 08/11/2010   Method used:   Electronically to        [facility]* (mail-order)       [address]       [place], [place]  [address]       Ph: [phone]       Fax: [phone]   RxID:   [id number] LEVOXYL 75 MCG  TABS (LEVOTHYROXINE SODIUM) takem 1 by mouth qd  #90 x 3   Entered and Authorized by:   Minus Breeding MD   Signed by:   Minus Breeding MD on 08/11/2010   Method used:   Electronically to        [facility]* (mail-order)       [address]       [place], [place]  [address]       Ph: [phone]       Fax: [phone]   RxID:   [id number] ZESTRIL 40 MG  TABS (LISINOPRIL) take 1 by mouth qd  #90 x 3   Entered and Authorized by:   Minus Breeding MD   Signed by:   Minus Breeding MD on 08/11/2010   Method used:   Electronically to        [facility]* (mail-order)       [address]       [place], [place]  [address]       Ph: [phone]       Fax: [phone]   RxID:   [id number] ACTOS 15 MG  TABS (PIOGLITAZONE HCL) take 1 by mouth qd  #90 x 3   Entered and Authorized by:   Minus Breeding MD   Signed by:   Minus Breeding MD on 08/11/2010   Method used:   Electronically to        [facility]* (mail-order)       [address]       [place],   [address]        Ph: [phone]       Fax: [phone]   RxID:   [id number] EVISTA 60 MG  TABS (RALOXIFENE HCL) take 1 by mouth qd  #90 x 3   Entered and Authorized by:   Minus Breeding MD   Signed by:   Minus Breeding MD on 08/11/2010   Method used:   Electronically to        [facility]* (mail-order)       [address]       [place],   [address]       Ph: [phone]       Fax: [phone]   RxID:   [id number] TRANSDERM-SCOP 1.5 MG PT72 (SCOPOLAMINE BASE) 1 every 3 days  #1 box x 0   Entered and Authorized by:   Minus Breeding MD   Signed by:   Minus Breeding MD on 08/11/2010   Method used:   Print then Give to Patient   RxID:   [id number]    Orders Added: 1)  EKG w/ Interpretation [93000] 2)  Gastroenterology Referral [GI] 3)  TLB-Lipid Panel [80061-LIPID] 4)  TLB-BMP (Basic Metabolic Panel-BMET) [80048-METABOL] 5)  TLB-CBC Platelet - w/Differential [85025-CBCD] 6)  TLB-Hepatic/Liver Function Pnl [80076-HEPATIC] 7)  TLB-TSH (Thyroid Stimulating Hormone) [84443-TSH] 8)  TLB-A1C /  Hgb A1C (Glycohemoglobin) [83036-A1C] 9)  TLB-Microalbumin/Creat Ratio, Urine [82043-MALB] 10)  TLB-Udip w/ Micro [81001-URINE] 11)  TLB-IBC Pnl (Iron/FE;Transferrin) [83550-IBC] 12)  TLB-B12, Serum-Total ONLY [82607-B12] 13)  MC -Subsequent Annual Wellness Visit [G0439]

## 2010-08-19 NOTE — Letter (Signed)
Summary: Pre Visit Letter Revised  [facility]  [address] [place], [place] [address]   Phone: [phone]  Fax: [phone]        08/15/2010 MRN: [id number] [name] [address] [place], [place]  [address]  [place]             Procedure Date:  September 23, 2010   dir col-Dr Annamarie Major to the Gastroenterology Division at [facility].    You are scheduled to see a nurse for your pre-procedure visit on September 09, 2010 at 1:00pm on the 3rd floor at [facility], [address]  We ask that you try to arrive at our office 15 minutes prior to your appointment time to allow for check-in.  Please take a minute to review the attached form.  If you answer "Yes" to one or more of the questions on the first page, we ask that you call the person listed at your earliest opportunity.  If you answer "No" to all of the questions, please complete the rest of the form and bring it to your appointment.    Your nurse visit will consist of discussing your medical and surgical history, your immediate family medical history, and your medications.   If you are unable to list all of your medications on the form, please bring the medication bottles to your appointment and we will list them.  We will need to be aware of both prescribed and over the counter drugs.  We will need to know exact dosage information as well.    Please be prepared to read and sign documents such as consent forms, a financial agreement, and acknowledgement forms.  If necessary, and with your consent, a friend or relative is welcome to sit-in on the nurse visit with you.  Please bring your insurance card so that we may make a copy of it.  If your insurance requires a referral to see a specialist, please bring your referral form from your primary care physician.  No co-pay is required for this nurse visit.     If you cannot keep your appointment, please call [phone] to cancel or reschedule prior to  your appointment date.  This allows [place] the opportunity to schedule an appointment for another patient in need of care.    Thank you for choosing [facility] for your medical needs.  We appreciate the opportunity to care for you.  Please visit [place] at our website  to learn more about our practice.  Sincerely, The Gastroenterology Division

## 2010-09-09 ENCOUNTER — Ambulatory Visit (AMBULATORY_SURGERY_CENTER): Payer: Medicare Other | Admitting: *Deleted

## 2010-09-09 VITALS — Ht 64.0 in | Wt 161.6 lb

## 2010-09-09 DIAGNOSIS — Z1211 Encounter for screening for malignant neoplasm of colon: Secondary | ICD-10-CM

## 2010-09-09 DIAGNOSIS — Z800 Family history of malignant neoplasm of digestive organs: Secondary | ICD-10-CM

## 2010-09-09 MED ORDER — PEG-KCL-NACL-NASULF-NA ASC-C 100 G PO SOLR: 1.0000 | Freq: Once | ORAL | Status: AC

## 2010-09-09 NOTE — Patient Instructions (Signed)
Pick up RX for Moviprep within 5 days of today.

## 2010-09-10 ENCOUNTER — Encounter: Payer: Self-pay | Admitting: Gastroenterology

## 2010-09-22 ENCOUNTER — Encounter: Payer: Self-pay | Admitting: Gastroenterology

## 2010-09-23 ENCOUNTER — Ambulatory Visit (AMBULATORY_SURGERY_CENTER): Payer: Medicare Other | Admitting: Gastroenterology

## 2010-09-23 ENCOUNTER — Encounter: Payer: Self-pay | Admitting: Gastroenterology

## 2010-09-23 VITALS — BP 138/76 | HR 68 | Temp 98.6°F | Resp 18 | Ht 64.0 in | Wt 160.0 lb

## 2010-09-23 DIAGNOSIS — K5730 Diverticulosis of large intestine without perforation or abscess without bleeding: Secondary | ICD-10-CM

## 2010-09-23 DIAGNOSIS — Z1211 Encounter for screening for malignant neoplasm of colon: Secondary | ICD-10-CM

## 2010-09-23 DIAGNOSIS — D126 Benign neoplasm of colon, unspecified: Secondary | ICD-10-CM

## 2010-09-23 NOTE — Patient Instructions (Signed)
Discharge instructions reviewed  Educational material on polyps, diverticulosis, and high fiber diet given  Please follow high fiber diet with liberal fluid intake  Pathology results will be received  In approximately 2 weeks via letter

## 2010-09-24 ENCOUNTER — Telehealth: Payer: Self-pay | Admitting: *Deleted

## 2010-09-24 NOTE — Telephone Encounter (Signed)

## 2010-09-30 ENCOUNTER — Encounter: Payer: Self-pay | Admitting: Gastroenterology

## 2010-10-29 ENCOUNTER — Other Ambulatory Visit: Payer: Self-pay | Admitting: Obstetrics & Gynecology

## 2010-10-29 ENCOUNTER — Encounter: Payer: Self-pay | Admitting: Obstetrics & Gynecology

## 2010-10-29 ENCOUNTER — Encounter (INDEPENDENT_AMBULATORY_CARE_PROVIDER_SITE_OTHER): Payer: Medicare Other | Admitting: Obstetrics & Gynecology

## 2010-10-29 DIAGNOSIS — Z1231 Encounter for screening mammogram for malignant neoplasm of breast: Secondary | ICD-10-CM

## 2010-10-29 DIAGNOSIS — Z113 Encounter for screening for infections with a predominantly sexual mode of transmission: Secondary | ICD-10-CM

## 2010-10-29 DIAGNOSIS — Z01419 Encounter for gynecological examination (general) (routine) without abnormal findings: Secondary | ICD-10-CM

## 2010-10-29 DIAGNOSIS — Z780 Asymptomatic menopausal state: Secondary | ICD-10-CM

## 2010-10-29 DIAGNOSIS — Z1272 Encounter for screening for malignant neoplasm of vagina: Secondary | ICD-10-CM

## 2010-10-29 DIAGNOSIS — Z1151 Encounter for screening for human papillomavirus (HPV): Secondary | ICD-10-CM

## 2010-10-31 NOTE — Assessment & Plan Note (Signed)
NAME:  [name], [name] NO.:  [id number]  MEDICAL RECORD NO.:  [id number]           PATIENT TYPE:  LOCATION:  [facility] [place]           FACILITY:  PHYSICIAN:  Allie Bossier, MD        DATE OF BIRTH:  Nov 29, 1945  DATE OF SERVICE:  10/29/2010                                 CLINIC NOTE  HISTORY OF PRESENT ILLNESS:  [name] is a 65 year old married white gravida 1, para 2.  She has 32 year old twin sons and three grandchildren.  She is here for a new GYN exam.  She only complains of vaginal dryness that makes intercourse difficult.  She wishes to restart Vagifem.  PAST MEDICAL HISTORY: 1. Borderline diabetes, she is overweight. 2. Hypertension. 3. Hyperlipidemia. 4. Hypothyroidism.  REVIEW OF SYSTEMS:  Married for 45 years.  Her husband is retiring next year and they plan to move to [place] soon thereafter.  She had sex rarely due to her vaginal dryness and his erectile dysfunction.  Her last Pap smear and mammogram were done in 2007.  Her DEXA she tells me is due.  Her colonoscopy was in April 2012 and was negative for malignancy.  PAST SURGICAL HISTORY:  She had a partial thyroidectomy after a goiter was diagnosed.  She has had arthroscopy of her knee and she has had tubal ligation done in 1977.  FAMILY HISTORY:  Negative for breast and GYN cancer.  Her mother was diagnosed with colon cancer at age 50.  LATEX ALLERGIES.:  None.  DRUG ALLERGIES:  DEMEROL causes nausea and ERYTHROMYCIN causes a rash.  MEDICATIONS: 1. Losartan/hydrochlorothiazide daily. 2. Amlodipine daily. 3. Lovastatin daily. 4. Lisinopril daily. 5. Carvedilol daily. 6. Levoxyl daily. 7. Evista daily. 8. Actos daily. 9. Ibuprofen daily.  PHYSICAL EXAMINATION:  GENERAL:  Well-nourished, well-hydrated pleasant white female. VITAL SIGNS:  Height 5 feet 4 inches, weight 163 pounds, blood pressure 126/81, and pulse 76. HEENT:  Normal. HEART:  Regular rate and  rhythm. BREASTS:  Normal bilaterally. ABDOMEN:  Benign.  Please note, she complained of some bulge of her midline of her abdomen.  Initially I thought it was rectus diastasis but on further inspection, it appears to be extra adipose tissue. EXTERNAL GENITALIA:  Significant for severe atrophy.  Her cervix is also atrophic.  No lesions.  Pap smear was obtained.  A bimanual exam reveals normal size and shape, anteverted mobile uterus, and nonenlarged adnexa. There is no tenderness with her bimanual exam.  ASSESSMENT AND PLAN: 1. Annual exam.  I have checked the Pap smear and scheduled a     mammogram.  Recommended self-breast and self vulvar exams. 2. Vaginal dryness.  The patient prefers to restart Vagifem.  I have     happily written her prescription for this.  I recommended that she     continue her exercise plan that she is currently doing this.     Allie Bossier, MD    MCD/MEDQ  D:  10/29/2010  T:  10/29/2010  Job:  [id number]

## 2010-11-04 ENCOUNTER — Ambulatory Visit
Admission: RE | Admit: 2010-11-04 | Discharge: 2010-11-04 | Disposition: A | Discharge status: Home / Self Care | Payer: Medicare Other | Source: Ambulatory Visit | Attending: Obstetrics & Gynecology | Admitting: Obstetrics & Gynecology

## 2010-11-04 DIAGNOSIS — Z780 Asymptomatic menopausal state: Secondary | ICD-10-CM

## 2010-11-04 DIAGNOSIS — Z1231 Encounter for screening mammogram for malignant neoplasm of breast: Secondary | ICD-10-CM

## 2010-11-07 ENCOUNTER — Other Ambulatory Visit: Payer: Self-pay | Admitting: Obstetrics & Gynecology

## 2010-11-07 DIAGNOSIS — R928 Other abnormal and inconclusive findings on diagnostic imaging of breast: Secondary | ICD-10-CM

## 2010-11-07 NOTE — H&P (Signed)
[name], [name] NO.:  [id number]   MEDICAL RECORD NO.:  [id number]                   PATIENT TYPE:  INP   LOCATION:  2550                                 FACILITY:  [facility]   PHYSICIAN:  Veverly Fells. Arletha Grippe, M.D.             DATE OF BIRTH:  [birth year]   DATE OF ADMISSION:  05/24/2002  DATE OF DISCHARGE:                                HISTORY & PHYSICAL   CHIEF COMPLAINT:  Right thyroid lobe mass.   HISTORY OF PRESENT ILLNESS:  The patient is a 65 year old white female  patient of Dr. Romero Belling who was noted on examination to have some  fullness involving the right neck and she had an ultrasound performed on  03/03/02 which did confirm a mass within the right thyroid lobe around the  mid portion of the gland.  This was about 2 x 2 cm in dimension with some  solid and cystic components to this.  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  At this point, she denies any dysphagia, hoarseness, weight loss, neck  swelling or pain.  She now presents for right thyroid lobectomy for  pathological diagnosis.   CURRENT MEDICATIONS:  1. Celebrex.  2. Evista.  3. Lisinopril.  4. Diovan.  5. Toprol.  6. Actos.  7. Premarin.   ALLERGIES:  DEMEROL AND ERYTHROMYCIN.   PAST MEDICAL/SURGICAL HISTORY:  History of non-insulin dependent diabetes  mellitus, hypertension, osteoarthritis and status post tubal ligation.  She  is a nonsmoker.   PHYSICAL EXAMINATION:  GENERAL:  The patient is a well-developed, well-  nourished 65 year old white female  in no acute distress.  NECK:  Does show some fullness involving the right neck area, but no  definitive lymphadenopathy or other masses besides this.  HEENT:  Oral cavity normal mucosa, teeth and gums.  No lesions.  Rest of her  head and neck examination is otherwise unremarkable.  BACK:  Without spinal or CVA tenderness.  CHEST:  Clear to percussion and auscultation.  CARDIAC:  Regular rate and rhythm.  Normal S1, S2.  No S3, S4 murmurs.  ABDOMEN:  Positive bowel sounds.  Soft without masses, distention,  tenderness or organomegaly.  Benign.  EXTREMITIES:  Full range of motion. Warm without cyanosis, clubbing, edema.  NEUROLOGIC:  Awake, alert and oriented x3.  Cranial nerves II-XII grossly  intact and nonfocal.   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  She does have history of hypertension and non-  insulin dependent diabetes mellitus.   PLAN:  The patient will undergo right total thyroid lobectomy with possible  total thyroidectomy based on frozen section examination of the tissue. She  will be admitted postoperatively for drain management and wound  care and  will be discharged home when stable.                                               Veverly Fells. Arletha Grippe, M.D.    MDR/MEDQ  D:  05/24/2002  T:  05/24/2002  Job:  [id number]   cc:   Gregary Signs A. Everardo All, M.D. [facility]

## 2010-11-07 NOTE — Op Note (Signed)
NAMEORRIE, LASCANO NO.:  [id number]   MEDICAL RECORD NO.:  [id number]                   PATIENT TYPE:  INP   LOCATION:  2550                                 FACILITY:  [facility]   PHYSICIAN:  Veverly Fells. Arletha Grippe, M.D.             DATE OF BIRTH:  06-21-1946   DATE OF PROCEDURE:  DATE OF DISCHARGE:                                 OPERATIVE REPORT   PREOPERATIVE DIAGNOSIS:  Right thyroid lobe mass.   POSTOPERATIVE DIAGNOSIS:  Right thyroid lobe mass.   PROCEDURE PERFORMED:  Right total thyroid lobectomy with monitoring of the  recurrent laryngeal nerve with a ZOMED NIMS laryngeal nerve monitor with  frozen section analysis.   SURGEON:  Veverly Fells. Arletha Grippe, M.D.   ASSISTANT:  Kinnie Scales. Annalee Genta, M.D.   ANESTHESIA:  General endotracheal.   INDICATIONS FOR SURGERY:  This is a 65 year old white female with history of  non-insulin dependent diabetes mellitus and hypertension who is being  followed by Dr. Romero Belling.  She does have a history of right thyroid lobe  mass which was identified on physical examination and confirmed with  ultrasound guidance and ultrasound FNA was performed that showed Hurthle  cell neoplasm.  Based on her history and physical examinations and findings  as noted, I have recommended proceeding with the above noted surgical  procedure.  I have discussed extensive with her and her family the risks and  benefits and surgery including risks from the general anesthesia, infection,  bleeding, injury to current laryngeal nerve possibility of permanent or  temporary hoarseness and thyroid injury resulting in  hypocalcemia either  permanent or temporary.  I also discussed with her type incision used,  management of drain after surgery and normal recovery period expected after  this type of surgery.  I have answered any questions and informed consent  obtained.   The patient presents for the above noted procedure.   OPERATIVE  FINDINGS:  Mass about 2 x 3 cm involving the right thyroid lobe  consistent on frozen section with a follicular neoplasm, probably a  hyperplastic nodule.  No associated lymphadenopathy.   DETAILS OF PROCEDURE PERFORMED:  The patient was brought to the operating  room and placed in supine position.  General endotracheal anesthesia  administered via the anesthesiologist without complications.  The patient  was administered 1 g of Ancef  IV x1 and 10 mg of  Decadron IV x1.  The  patient was intubated with the ZOMED laryngeal nerve monitor tube.  It is  positioned appropriately.  The tube was then grounded and then hooked up to  the monitor.  It was then noted to be in good condition.  The shoulders were  placed in a shoulder roll and head was placed in doughnut. Head of the table  was elevated approximately  30 degrees.  The patient's neck was sterilely  prepped in standard fashion.  A natural skin  crease was marked out  horizontally approximately  two fingerbreadths above the clavicle and  sternal notch.  Incision was marked and injected with total of  9 cc of 1%  lidocaine solution with 1:100,000 epinephrine.  Incision in total was  approximately 4 to 5 cm in length.  After allowing appropriate time for  hemostasis, incision was made with scalpel.  Blunt dissection was carried  down through subcutaneus tissues and through platysma.  Bleeding from the  cutters in the skin were controlled with electrocautery.  Subplatysmal  planes were elevated superiorly up to the level of the thyroid  _________  cartilage and down below to the area of the sternal notch and clavicles.  Strap muscles were identified, divided in the midline with a bipolar  Metzenbaum scissors and retracted medially.  The thyroid lobe in the right  side which was enlarged was identified and then strap muscles were both  bluntly and sharply dissected off of the thyroid lobe.  Middle thyroid vein  was identified and was divided  between hemostats and tied off with 3-0 silk  ties.  Next, the inferior pedicle was identified as was the carotid artery  in the right side and the midline trachea.  Blunt dissection of this area  did identify the recurrent laryngeal nerve which was confirmed with the  ZOMED NIMS laryngeal nerve monitor with good stimulation.  This was  preserved in its entirety and not injured at anytime.  Inferior pedicle  vessels were then taken close to the thyroid gland itself not to injure any  blood supply to the inferior parathyroid glands.  They were divided between  hemostats and tied with 2-0 silk ties.  Recurrent laryngeal nerve was  tracked up posteriorly to the gland up to Barry's ligament.  Superior  pedicle vessels were also identified and were taken between hemostats and  suture ligated with 2-0 silk stick ties.  The isthmus was then transected  overlying the trachea in the midline vertically after the isthmus was  dissected bluntly off the trachea.  It was divided between a Kerry clamp and  tied suture ligated with 2-0 Chromic suture.  Both blunt and sharp  dissection was used to dissect through Barry's ligament which was very  adherent in around the recurrent laryngeal nerve on the right side but the  nerve was not damaged at any time.  Barry's ligament was then transected  using both blunt and sharp dissection and they right thyroid lobe was sent  to pathology for frozen section analysis. This did come back consistent with  a follicular neoplasm, probably a hyperplastic nodule, but no overt signs of  carcinoma at this time.  The wound was then irrigated with copious amounts  of irrigation fluid, and suctioned dry.  There was no evidence of any active  bleeding.  The area around the recurrent laryngeal nerve as it  went into  the larynx was then packed lightly with some trimmed Surgicel dissolvable packing.  Valsalva was given by the nurse anesthetist with no evidence of  any active  bleeding.  A 7 mm perforated flat Blake drain was inserted into  the area through a separate stab incision involving the right inferior neck  and it was sutured to the neck skin with a 2-0 silk stitch.  Strap muscles  were reapproximated in the midline using 3-0 Vicryl suture.  Subcutaneous  tissues and platysma were also reapproximated with interrupted 3-0 and 4-0  Vicryl suture and final skin closure was achieved  with a running 5-0  interlocking Nylon stitch.   FLUIDS GIVEN IN PROCEDURE:  Approximately 1500 cc of ___________.   ESTIMATED BLOOD LOSS:  50 cc.   URINE OUTPUT:  Was not measured.   DRAINS/PACKS:  The above noted ______________drain was placed.  There are no  packings.   SPECIMENS:  Right thyroid lobe and isthmus.   The patient tolerated procedure well without complications.  _____________  transferred to recovery room in stable condition.   Sponge and needle counts correct.  Total duration of procedure was  approximately two hours.                                                Veverly Fells. Arletha Grippe, M.D.    MDR/MEDQ  D:  05/24/2002  T:  05/24/2002  Job:  [id number]

## 2010-11-07 NOTE — Discharge Summary (Signed)
[name], [name] NO.:  [id number]   MEDICAL RECORD NO.:  [id number]                   PATIENT TYPE:  INP   LOCATION:  5740                                 FACILITY:  [facility]   PHYSICIAN:  Veverly Fells. Arletha Grippe, M.D.             DATE OF BIRTH:  1945-12-31   DATE OF ADMISSION:  05/24/2002  DATE OF DISCHARGE:  05/26/2002                                 DISCHARGE SUMMARY   DIAGNOSIS FOR ADMISSION:  Right thyroid lobe mass.   PROCEDURE PERFORMED:  Right total thyroid lobectomy May 24, 2002.   HISTORY OF PRESENT ILLNESS:  Please note complete dictated history and  physical examination in the chart.   REVIEW OF HOSPITAL COURSE:  The patient underwent a right total thyroid  lobectomy for what appeared to be a follicular neoplasm most likely a  hyperplastic follicular nodule on May 24, 2002, she tolerated this well  without problems, was transferred to the recovery room and then to the  surgical nursing floor without difficulty.  Closed suction drain was left in  place for approximately 2 days for drain output that was moderate the first  day but then decreased over the morning the second day after surgery.  Postoperative calcium was in the normal range around 8.9, she was  asymptomatic, did not have any symptoms of hypocalcemia.  She was placed on  IV antibiotics during her entire hospitalization, started off on a liquid  diet and then advanced to a regular without difficulty.  She does have a  history of noninsulin-dependent diabetes mellitus which was monitored  through her hospitalization and was not noted to have any problems from  this.  She was ambulating without difficulty and her entire hospital course  she was afebrile and her vital signs were normal.  Postop day #2, which was  May 26, 2002, there was minimal drain output, the Jackson-Pratt drain  was removed without difficulty and a pressure bandage was placed around the  patient's  neck, she was improved enough to be discharged home.   DISCHARGE MEDICATIONS:  1. Keflex 500 mg p.o. t.i.d. for 10 days.  2. Vicodin #30 with two refills one to two tabs p.o. q.4h. p.r.n. pain.   DIET AND ACTIVITY:  She is to have regular diet and activity after surgery.   WOUND CARE:  She is to remove the dressing May 27, 2002 and then may get  the wound wet in shower.  She is to place antibiotic ointment on the wound  three times daily.   FOLLOW UP:  She will follow up in the office for suture removal on May 30, 2002.   CONDITION ON DISCHARGE:  Stable.  Veverly Fells. Arletha Grippe, M.D.    MDR/MEDQ  D:  05/26/2002  T:  05/26/2002  Job:  [id number]   cc:   Gregary Signs A. Everardo All, M.D. [facility]

## 2010-11-21 ENCOUNTER — Other Ambulatory Visit: Payer: Self-pay | Admitting: Obstetrics & Gynecology

## 2010-11-21 ENCOUNTER — Ambulatory Visit
Admission: RE | Admit: 2010-11-21 | Discharge: 2010-11-21 | Disposition: A | Discharge status: Home / Self Care | Payer: Medicare Other | Source: Ambulatory Visit | Attending: Obstetrics & Gynecology | Admitting: Obstetrics & Gynecology

## 2010-11-21 DIAGNOSIS — R928 Other abnormal and inconclusive findings on diagnostic imaging of breast: Secondary | ICD-10-CM

## 2010-11-26 ENCOUNTER — Ambulatory Visit
Admission: RE | Admit: 2010-11-26 | Discharge: 2010-11-26 | Disposition: A | Discharge status: Home / Self Care | Payer: Medicare Other | Source: Ambulatory Visit | Attending: Obstetrics & Gynecology | Admitting: Obstetrics & Gynecology

## 2010-11-26 ENCOUNTER — Other Ambulatory Visit: Payer: Self-pay | Admitting: Diagnostic Radiology

## 2010-11-26 DIAGNOSIS — R928 Other abnormal and inconclusive findings on diagnostic imaging of breast: Secondary | ICD-10-CM

## 2011-08-13 ENCOUNTER — Other Ambulatory Visit (INDEPENDENT_AMBULATORY_CARE_PROVIDER_SITE_OTHER): Payer: Medicare Other

## 2011-08-13 ENCOUNTER — Ambulatory Visit (INDEPENDENT_AMBULATORY_CARE_PROVIDER_SITE_OTHER): Payer: Medicare Other | Admitting: Endocrinology

## 2011-08-13 ENCOUNTER — Encounter: Payer: Self-pay | Admitting: Endocrinology

## 2011-08-13 DIAGNOSIS — D649 Anemia, unspecified: Secondary | ICD-10-CM

## 2011-08-13 DIAGNOSIS — E119 Type 2 diabetes mellitus without complications: Secondary | ICD-10-CM

## 2011-08-13 DIAGNOSIS — I10 Essential (primary) hypertension: Secondary | ICD-10-CM

## 2011-08-13 DIAGNOSIS — R200 Anesthesia of skin: Secondary | ICD-10-CM

## 2011-08-13 DIAGNOSIS — E890 Postprocedural hypothyroidism: Secondary | ICD-10-CM

## 2011-08-13 DIAGNOSIS — Z79899 Other long term (current) drug therapy: Secondary | ICD-10-CM

## 2011-08-13 DIAGNOSIS — R209 Unspecified disturbances of skin sensation: Secondary | ICD-10-CM

## 2011-08-13 DIAGNOSIS — E7800 Pure hypercholesterolemia, unspecified: Secondary | ICD-10-CM

## 2011-08-13 LAB — URINALYSIS, ROUTINE W REFLEX MICROSCOPIC
Bilirubin Urine: NEGATIVE
Hgb urine dipstick: NEGATIVE
Nitrite: NEGATIVE
Total Protein, Urine: NEGATIVE
Urobilinogen, UA: 0.2 (ref 0.0–1.0)

## 2011-08-13 LAB — CBC WITH DIFFERENTIAL/PLATELET
Basophils Absolute: 0 10*3/uL (ref 0.0–0.1)
Basophils Relative: 0.4 % (ref 0.0–3.0)
Eosinophils Absolute: 0 10*3/uL (ref 0.0–0.7)
MCHC: 34 g/dL (ref 30.0–36.0)
MCV: 96.1 fl (ref 78.0–100.0)
Monocytes Absolute: 0.5 10*3/uL (ref 0.1–1.0)
Neutro Abs: 3.9 10*3/uL (ref 1.4–7.7)
Neutrophils Relative %: 68 % (ref 43.0–77.0)
RBC: 3.56 Mil/uL — ABNORMAL LOW (ref 3.87–5.11)
RDW: 14.6 % (ref 11.5–14.6)

## 2011-08-13 LAB — LIPID PANEL
HDL: 113 mg/dL (ref >39.00)
LDL Cholesterol: 65 mg/dL (ref 0–99)
Total CHOL/HDL Ratio: 2
VLDL: 11.4 mg/dL (ref 0.0–40.0)

## 2011-08-13 LAB — IBC PANEL: Transferrin: 260.8 mg/dL (ref 212.0–360.0)

## 2011-08-13 LAB — MICROALBUMIN / CREATININE URINE RATIO: Microalb, Ur: 1.3 mg/dL (ref 0.0–1.9)

## 2011-08-13 LAB — HEPATIC FUNCTION PANEL
AST: 24 U/L (ref 0–37)
Albumin: 4.3 g/dL (ref 3.5–5.2)
Alkaline Phosphatase: 43 U/L (ref 39–117)
Total Protein: 6.9 g/dL (ref 6.0–8.3)

## 2011-08-13 LAB — BASIC METABOLIC PANEL
BUN: 32 mg/dL — ABNORMAL HIGH (ref 6–23)
Chloride: 98 mEq/L (ref 96–112)
Creatinine, Ser: 1 mg/dL (ref 0.4–1.2)

## 2011-08-13 LAB — TSH: TSH: 0.38 u[IU]/mL (ref 0.35–5.50)

## 2011-08-13 MED ORDER — ZOSTER VACCINE LIVE 19400 UNT/0.65ML ~~LOC~~ SOLR: 0.6500 mL | Freq: Once | SUBCUTANEOUS | Status: AC

## 2011-08-13 NOTE — Patient Instructions (Addendum)
blood tests are being requested for you today.  please call [phone] to hear your test results.  You will be prompted to enter the 9-digit "MRN" number that appears at the top left of this page, followed by #.  Then you will hear the message. please consider these measures for your health:  minimize alcohol.  do not use tobacco products.  have a colonoscopy at least every 10 years from age 67.  Women should have an annual mammogram from age 5.  keep firearms safely stored.  always use seat belts.  have working smoke alarms in your home.  see an eye doctor and dentist regularly.  never drive under the influence of alcohol or drugs (including prescription drugs).  those with fair skin should take precautions against the sun. please let me know what your wishes would be, if artificial life support measures should become necessary.  it is critically important to prevent falling down (keep floor areas well-lit, dry, and free of loose objects.  If you have a cane, walker, or wheelchair, you should use it, even for short trips around the house.  Also, try not to rush). You should have a vaccine against shingles (a painful rash which results from the  chickenpox infection which most people had many years ago).  This vaccine reduces, but does not totally eliminate the risk of shingles.  Because this is a medicare part d benefit, you should get it at a pharmacy.   Please return in 1 year. (update: i left message on phone-tree:  We'll follow K+ and anemia)

## 2011-08-13 NOTE — Progress Notes (Signed)
Subjective:    Patient ID: [name], female    DOB: 1946-03-20, 66 y.o.   MRN: [id number]  HPI here for regular wellness examination.  He's feeling pretty well in general, and says chronic med probs are stable, except as noted below.  Gyn in [place] does mammography and dexa.   Past Medical History  Diagnosis Date  . Blood transfusion     for GI bleed 1987  . Arthritis   . Diabetes mellitus   . Thyroid disease   . Hypertension     Past Surgical History  Procedure Date  . Thyroidectomy, partial   . Rt knee arthroscopy     History   Social History  . Marital Status: Married    Spouse Name: N/A    Number of Children: N/A  . Years of Education: N/A   Occupational History  . Not on file.   Social History Main Topics  . Smoking status: Former Smoker    Quit date: 09/09/1986  . Smokeless tobacco: Not on file  . Alcohol Use: 7.0 oz/week    14 drink(s) per week  . Drug Use: No  . Sexually Active: Not on file   Other Topics Concern  . Not on file   Social History Narrative  . No narrative on file    Current Outpatient Prescriptions on File Prior to Visit  Medication Sig Dispense Refill  . amLODipine (NORVASC) 2.5 MG tablet Take 2.5 mg by mouth daily.        . carvedilol (COREG) 6.25 MG tablet Take 6.25 mg by mouth 2 (two) times daily with a meal.        . ibuprofen (ADVIL,MOTRIN) 200 MG tablet daily. 3 tablets every morning      . levothyroxine (LEVOXYL) 75 MCG tablet Take 75 mcg by mouth daily.        Marland Kitchen lisinopril (PRINIVIL,ZESTRIL) 40 MG tablet Take 40 mg by mouth daily.        Marland Kitchen losartan-hydrochlorothiazide (HYZAAR) 100-25 MG per tablet Take 1 tablet by mouth daily.        Marland Kitchen lovastatin (MEVACOR) 40 MG tablet Take 40 mg by mouth 2 (two) times daily.        . pioglitazone (ACTOS) 15 MG tablet Take 15 mg by mouth daily.        . raloxifene (EVISTA) 60 MG tablet Take 60 mg by mouth daily.          Allergies  Allergen Reactions  . Erythromycin    REACTION: Rash  . Meperidine Hcl     REACTION: Nausea    Family History  Problem Relation Age of Onset  . Colon cancer Mother     BP 118/74  Pulse 75  Temp(Src) 98.3 F (36.8 C) (Oral)  Ht 5\' 3"  (1.6 m)  Wt 168 lb 1.9 oz (76.259 kg)  BMI 29.78 kg/m2  SpO2 94%     Review of Systems  Constitutional: Negative for fever and unexpected weight change.  HENT: Negative for hearing loss.   Eyes: Negative for visual disturbance.  Respiratory: Negative for shortness of breath.   Cardiovascular: Negative for chest pain.  Gastrointestinal: Negative for anal bleeding.  Genitourinary: Negative for hematuria.  Musculoskeletal: Positive for back pain.  Skin: Negative for rash.  Neurological: Negative for syncope and numbness.       Slight numbness of the legs.   Hematological: Does not bruise/bleed easily.  Psychiatric/Behavioral: Negative for dysphoric mood.       Objective:  Physical Exam VS: see vs page GEN: no distress HEAD: head: no deformity eyes: no periorbital swelling, no proptosis external nose and ears are normal mouth: no lesion seen Neck: a healed scar is present (partial thyroidect 2004).  i do not appreciate a nodule in the thyroid or elsewhere in the neck CHEST WALL: no deformity LUNGS:  Clear to auscultation BREASTS:  sees gyn CV: reg rate and rhythm, no murmur ABD: abdomen is soft, nontender.  no hepatosplenomegaly.  not distended.  no hernia GENITALIA/RECTAL: sees gyn MUSCULOSKELETAL: muscle bulk and strength are grossly normal.  no obvious joint swelling.  gait is normal and steady EXTEMITIES: no deformity.  no ulcer on the feet.  feet are of normal color and temp.  no edema PULSES: dorsalis pedis intact bilat.  no carotid bruit.   NEURO:  cn 2-12 grossly intact.   readily moves all 4's.  sensation is intact to touch on the feet SKIN:  Normal texture and temperature.  No rash or suspicious lesion is visible.   NODES:  None palpable at the neck PSYCH:  alert, oriented x3.  Does not appear anxious nor depressed.   Lab Results  Component Value Date   WBC 5.8 08/13/2011   HGB 11.6* 08/13/2011   HCT 34.2* 08/13/2011   PLT 240.0 08/13/2011   GLUCOSE 106* 08/13/2011   CHOL 189 08/13/2011   TRIG 57.0 08/13/2011   HDL 113.00 08/13/2011   LDLDIRECT 96.1 08/11/2010   LDLCALC 65 08/13/2011   ALT 17 08/13/2011   AST 24 08/13/2011   NA 135 08/13/2011   K 3.4* 08/13/2011   CL 98 08/13/2011   CREATININE 1.0 08/13/2011   BUN 32* 08/13/2011   CO2 27 08/13/2011   TSH 0.38 08/13/2011   HGBA1C 5.9 08/13/2011   MICROALBUR 1.3 08/13/2011      Assessment & Plan:  Wellness visit today, with problems stable, except as noted.

## 2011-08-25 ENCOUNTER — Other Ambulatory Visit: Payer: Self-pay

## 2011-08-25 MED ORDER — AMLODIPINE BESYLATE 2.5 MG PO TABS: 2.5000 mg | ORAL_TABLET | Freq: Every day | ORAL | Status: DC

## 2011-08-25 MED ORDER — PIOGLITAZONE HCL 15 MG PO TABS: 15.0000 mg | ORAL_TABLET | Freq: Every day | ORAL | Status: DC

## 2011-08-25 MED ORDER — LEVOTHYROXINE SODIUM 75 MCG PO TABS: 75.0000 ug | ORAL_TABLET | Freq: Every day | ORAL | Status: DC

## 2011-08-25 MED ORDER — LOVASTATIN 40 MG PO TABS: 40.0000 mg | ORAL_TABLET | Freq: Two times a day (BID) | ORAL | Status: DC

## 2011-08-25 MED ORDER — LOSARTAN POTASSIUM-HCTZ 100-25 MG PO TABS: 1.0000 | ORAL_TABLET | Freq: Every day | ORAL | Status: DC

## 2011-08-25 MED ORDER — LISINOPRIL 40 MG PO TABS: 40.0000 mg | ORAL_TABLET | Freq: Every day | ORAL | Status: DC

## 2011-08-25 MED ORDER — CARVEDILOL 6.25 MG PO TABS: 6.2500 mg | ORAL_TABLET | Freq: Two times a day (BID) | ORAL | Status: DC

## 2011-08-25 MED ORDER — RALOXIFENE HCL 60 MG PO TABS: 60.0000 mg | ORAL_TABLET | Freq: Every day | ORAL | Status: DC

## 2011-08-25 NOTE — Telephone Encounter (Signed)
Pt called requesting refills of her medication to her mail order pharmacy. Pt also requests lab results mailed to her home. Left message on machine for pt advising of refills and labs.

## 2011-11-03 ENCOUNTER — Other Ambulatory Visit: Payer: Self-pay | Admitting: Obstetrics & Gynecology

## 2011-11-03 DIAGNOSIS — Z1231 Encounter for screening mammogram for malignant neoplasm of breast: Secondary | ICD-10-CM

## 2011-12-08 ENCOUNTER — Ambulatory Visit
Admission: RE | Admit: 2011-12-08 | Discharge: 2011-12-08 | Disposition: A | Discharge status: Home / Self Care | Payer: Medicare Other | Source: Ambulatory Visit | Attending: Obstetrics & Gynecology | Admitting: Obstetrics & Gynecology

## 2011-12-08 DIAGNOSIS — Z1231 Encounter for screening mammogram for malignant neoplasm of breast: Secondary | ICD-10-CM

## 2011-12-28 DIAGNOSIS — M545 Low back pain: Secondary | ICD-10-CM | POA: Insufficient documentation

## 2012-01-12 DIAGNOSIS — M48061 Spinal stenosis, lumbar region without neurogenic claudication: Secondary | ICD-10-CM | POA: Insufficient documentation

## 2012-06-03 ENCOUNTER — Telehealth: Payer: Self-pay | Admitting: Endocrinology

## 2012-06-03 MED ORDER — AMLODIPINE BESYLATE 2.5 MG PO TABS: 2.5000 mg | ORAL_TABLET | Freq: Every day | ORAL | Status: DC

## 2012-06-03 NOTE — Telephone Encounter (Signed)
Pt req new prescription of Amlodioine 2.5 mg to be send to [facility]. Please call pt if this is ok.

## 2012-06-03 NOTE — Telephone Encounter (Signed)
Ok.  Please refill prn 

## 2012-06-10 ENCOUNTER — Other Ambulatory Visit: Payer: Self-pay | Admitting: *Deleted

## 2012-06-10 MED ORDER — AMLODIPINE BESYLATE 2.5 MG PO TABS: 2.5000 mg | ORAL_TABLET | Freq: Every day | ORAL | Status: DC

## 2012-07-01 ENCOUNTER — Other Ambulatory Visit: Payer: Self-pay

## 2012-07-01 ENCOUNTER — Telehealth: Payer: Self-pay | Admitting: *Deleted

## 2012-07-01 MED ORDER — LOSARTAN POTASSIUM-HCTZ 100-25 MG PO TABS: 1.0000 | ORAL_TABLET | Freq: Every day | ORAL | Status: DC

## 2012-07-01 NOTE — Telephone Encounter (Signed)
Pt requesting refill of Hyzarr sent to [facility]. Please let pt know when completed.

## 2012-07-14 ENCOUNTER — Other Ambulatory Visit: Payer: Self-pay | Admitting: *Deleted

## 2012-07-14 MED ORDER — LISINOPRIL 40 MG PO TABS: 40.0000 mg | ORAL_TABLET | Freq: Every day | ORAL | Status: DC

## 2012-08-22 ENCOUNTER — Encounter: Payer: Self-pay | Admitting: Endocrinology

## 2012-08-31 ENCOUNTER — Ambulatory Visit (INDEPENDENT_AMBULATORY_CARE_PROVIDER_SITE_OTHER): Payer: Medicare Other | Admitting: Endocrinology

## 2012-08-31 ENCOUNTER — Encounter: Payer: Self-pay | Admitting: Endocrinology

## 2012-08-31 ENCOUNTER — Ambulatory Visit (INDEPENDENT_AMBULATORY_CARE_PROVIDER_SITE_OTHER): Payer: Medicare Other | Admitting: *Deleted

## 2012-08-31 VITALS — BP 126/76 | HR 81 | Wt 169.0 lb

## 2012-08-31 DIAGNOSIS — E890 Postprocedural hypothyroidism: Secondary | ICD-10-CM

## 2012-08-31 DIAGNOSIS — M545 Low back pain, unspecified: Secondary | ICD-10-CM

## 2012-08-31 DIAGNOSIS — E119 Type 2 diabetes mellitus without complications: Secondary | ICD-10-CM

## 2012-08-31 DIAGNOSIS — Z79899 Other long term (current) drug therapy: Secondary | ICD-10-CM

## 2012-08-31 DIAGNOSIS — E7800 Pure hypercholesterolemia, unspecified: Secondary | ICD-10-CM

## 2012-08-31 DIAGNOSIS — D649 Anemia, unspecified: Secondary | ICD-10-CM

## 2012-08-31 DIAGNOSIS — Z0000 Encounter for general adult medical examination without abnormal findings: Secondary | ICD-10-CM

## 2012-08-31 DIAGNOSIS — I10 Essential (primary) hypertension: Secondary | ICD-10-CM

## 2012-08-31 DIAGNOSIS — R9431 Abnormal electrocardiogram [ECG] [EKG]: Secondary | ICD-10-CM

## 2012-08-31 LAB — BASIC METABOLIC PANEL
BUN: 16 mg/dL (ref 6–23)
Calcium: 9 mg/dL (ref 8.4–10.5)
Creatinine, Ser: 0.9 mg/dL (ref 0.4–1.2)
GFR: 65.37 mL/min (ref >60.00)
Glucose, Bld: 102 mg/dL — ABNORMAL HIGH (ref 70–99)
Sodium: 139 mEq/L (ref 135–145)

## 2012-08-31 LAB — CBC WITH DIFFERENTIAL/PLATELET
Basophils Absolute: 0 10*3/uL (ref 0.0–0.1)
Basophils Relative: 0.2 % (ref 0.0–3.0)
Hemoglobin: 10.8 g/dL — ABNORMAL LOW (ref 12.0–15.0)
Lymphocytes Relative: 17.5 % (ref 12.0–46.0)
Monocytes Relative: 8.4 % (ref 3.0–12.0)
Neutro Abs: 4.2 10*3/uL (ref 1.4–7.7)
Neutrophils Relative %: 73.1 % (ref 43.0–77.0)
RBC: 3.38 Mil/uL — ABNORMAL LOW (ref 3.87–5.11)

## 2012-08-31 LAB — URINALYSIS, ROUTINE W REFLEX MICROSCOPIC
Nitrite: NEGATIVE
Specific Gravity, Urine: 1.02 (ref 1.000–1.030)
Urine Glucose: NEGATIVE
pH: 6 (ref 5.0–8.0)

## 2012-08-31 LAB — HEPATIC FUNCTION PANEL
ALT: 13 U/L (ref 0–35)
AST: 19 U/L (ref 0–37)
Bilirubin, Direct: 0 mg/dL (ref 0.0–0.3)
Total Bilirubin: 0.4 mg/dL (ref 0.3–1.2)
Total Protein: 6.8 g/dL (ref 6.0–8.3)

## 2012-08-31 LAB — LIPID PANEL
Cholesterol: 181 mg/dL (ref 0–200)
VLDL: 23.8 mg/dL (ref 0.0–40.0)

## 2012-08-31 LAB — IBC PANEL
Saturation Ratios: 17.3 % — ABNORMAL LOW (ref 20.0–50.0)
Transferrin: 280.6 mg/dL (ref 212.0–360.0)

## 2012-08-31 MED ORDER — DICLOFENAC EPOLAMINE 1.3 % TD PTCH: 1.0000 | MEDICATED_PATCH | Freq: Two times a day (BID) | TRANSDERMAL | Status: DC

## 2012-08-31 MED ORDER — ZOSTER VACCINE LIVE 19400 UNT/0.65ML ~~LOC~~ SOLR: 0.6500 mL | Freq: Once | SUBCUTANEOUS | Status: DC

## 2012-08-31 NOTE — Patient Instructions (Addendum)
blood tests are being requested for you today.  We'll contact you with results. please consider these measures for your health:  minimize alcohol.  do not use tobacco products.  have a colonoscopy at least every 10 years from age 68.  Women should have an annual mammogram from age 75.  keep firearms safely stored.  always use seat belts.  have working smoke alarms in your home.  see an eye doctor and dentist regularly.  never drive under the influence of alcohol or drugs (including prescription drugs).  those with fair skin should take precautions against the sun.   it is critically important to prevent falling down (keep floor areas well-lit, dry, and free of loose objects.  If you have a cane, walker, or wheelchair, you should use it, even for short trips around the house.  Also, try not to rush).   here are some tests for blood in the bowels.  please follow the instructions, and return to the lab downstairs.   Refer to a blood specialist.  you will receive a phone call, about a day and time for an appointment. i have sent a prescription to your pharmacy, for a pain patch. Please return in 1 year.

## 2012-08-31 NOTE — Progress Notes (Signed)
Subjective:    Patient ID: [name], female    DOB: [birth year], 67 y.o.   MRN: [id number]  HPI here for regular wellness examination.  He's feeling pretty well in general, and says chronic med probs are stable, except as noted below Past Medical History  Diagnosis Date  . Blood transfusion     for GI bleed 1987  . Arthritis   . Diabetes mellitus   . Thyroid disease   . Hypertension     Past Surgical History  Procedure Laterality Date  . Thyroidectomy, partial    . Rt knee arthroscopy      History   Social History  . Marital Status: Married    Spouse Name: N/A    Number of Children: N/A  . Years of Education: N/A   Occupational History  . Not on file.   Social History Main Topics  . Smoking status: Former Smoker    Quit date: 09/09/1986  . Smokeless tobacco: Not on file  . Alcohol Use: 7.0 oz/week    14 drink(s) per week  . Drug Use: No  . Sexually Active: Not on file   Other Topics Concern  . Not on file   Social History Narrative  . No narrative on file    Current Outpatient Prescriptions on File Prior to Visit  Medication Sig Dispense Refill  . amLODipine (NORVASC) 2.5 MG tablet Take 1 tablet (2.5 mg total) by mouth daily.  90 tablet  0  . carvedilol (COREG) 6.25 MG tablet Take 1 tablet (6.25 mg total) by mouth 2 (two) times daily with a meal.  180 tablet  3  . ibuprofen (ADVIL,MOTRIN) 200 MG tablet daily. 3 tablets every morning      . levothyroxine (LEVOXYL) 75 MCG tablet Take 1 tablet (75 mcg total) by mouth daily.  90 tablet  3  . lisinopril (PRINIVIL,ZESTRIL) 40 MG tablet Take 1 tablet (40 mg total) by mouth daily.  90 tablet  0  . losartan-hydrochlorothiazide (HYZAAR) 100-25 MG per tablet Take 1 tablet by mouth daily.  90 tablet  3  . lovastatin (MEVACOR) 40 MG tablet Take 1 tablet (40 mg total) by mouth 2 (two) times daily.  180 tablet  3  . pioglitazone (ACTOS) 15 MG tablet Take 1 tablet (15 mg total) by mouth daily.  90 tablet  3  .  raloxifene (EVISTA) 60 MG tablet Take 1 tablet (60 mg total) by mouth daily.  90 tablet  3   No current facility-administered medications on file prior to visit.    Allergies  Allergen Reactions  . Erythromycin     REACTION: Rash  . Meperidine Hcl     REACTION: Nausea    Family History  Problem Relation Age of Onset  . Colon cancer Mother     BP 126/76  Pulse 81  Wt 169 lb (76.658 kg)  BMI 29.94 kg/m2  SpO2 95%  Review of Systems  Constitutional: Negative for fever.  HENT: Negative for hearing loss.   Eyes: Negative for visual disturbance.  Respiratory: Negative for shortness of breath.   Cardiovascular: Negative for chest pain.  Endocrine: Negative for polyuria.  Genitourinary: Negative for hematuria.  Musculoskeletal: Positive for back pain.  Skin: Negative for rash.  Allergic/Immunologic: Negative for environmental allergies.  Neurological: Negative for syncope.  Hematological: Bruises/bleeds easily.  Psychiatric/Behavioral: Negative for dysphoric mood.       Objective:   Physical Exam VS: see vs page GEN: no distress HEAD: head: no deformity  eyes: no periorbital swelling, no proptosis external nose and ears are normal mouth: no lesion seen NECK: supple, thyroid is not enlarged CHEST WALL: no deformity LUNGS:  Clear to auscultation BREASTS: sees gyn CV: reg rate and rhythm, no murmur ABD: abdomen is soft, nontender.  no hepatosplenomegaly.  not distended.  no hernia GENITALIA/RECTAL: sees gyn MUSCULOSKELETAL: muscle bulk and strength are grossly normal.  no obvious joint swelling.  gait is normal and steady EXTEMITIES: no deformity.  no ulcer on the feet.  feet are of normal color and temp.  no edema PULSES: dorsalis pedis intact bilat.  no carotid bruit NEURO:  cn 2-12 grossly intact.   readily moves all 4's.   SKIN:  Normal texture and temperature.  No rash or suspicious lesion is visible.   NODES:  None palpable at the neck PSYCH: alert, oriented  x3.  Does not appear anxious nor depressed.     Assessment & Plan:  Wellness visit today, with problems stable, except as noted.  we discussed code status.  pt requests full code, but would not want to be started or maintained on artificial life-support measures if there was not a reasonable chance of recovery.      SEPARATE EVALUATION FOLLOWS--EACH PROBLEM HERE IS NEW, NOT RESPONDING TO TREATMENT, OR POSES SIGNIFICANT RISK TO THE PATIENT'S HEALTH: HISTORY OF THE PRESENT ILLNESS: Pt was incidentally noted on MRI to have abnormal bone marrow. Pt has several years of moderate pain at the lower back, but no assoc numbness.  she did not tolerate vicodin or tramadol. PAST MEDICAL HISTORY reviewed and up to date today REVIEW OF SYSTEMS: Denies weight change and brbpr PHYSICAL EXAMINATION: VITAL SIGNS:  See vs page GENERAL: no distress Neuro: sensation is intact to touch on the feet LAB/XRAY RESULTS: Lab Results  Component Value Date   WBC 5.8 08/31/2012   HGB 10.8* 08/31/2012   HCT 31.9* 08/31/2012   MCV 94.3 08/31/2012   PLT 329.0 08/31/2012  IMPRESSION: Anemia, uncertain etiology abnormal marrow on mri, uncertain etiology PLAN: See instruction page

## 2012-09-02 ENCOUNTER — Encounter: Payer: Self-pay | Admitting: Endocrinology

## 2012-09-14 ENCOUNTER — Ambulatory Visit (HOSPITAL_COMMUNITY): Payer: Medicare Other | Attending: Endocrinology

## 2012-09-14 DIAGNOSIS — D649 Anemia, unspecified: Secondary | ICD-10-CM | POA: Insufficient documentation

## 2012-09-14 DIAGNOSIS — R9431 Abnormal electrocardiogram [ECG] [EKG]: Secondary | ICD-10-CM | POA: Insufficient documentation

## 2012-09-14 DIAGNOSIS — E119 Type 2 diabetes mellitus without complications: Secondary | ICD-10-CM | POA: Insufficient documentation

## 2012-09-14 DIAGNOSIS — I10 Essential (primary) hypertension: Secondary | ICD-10-CM | POA: Insufficient documentation

## 2012-09-14 DIAGNOSIS — Z87891 Personal history of nicotine dependence: Secondary | ICD-10-CM | POA: Insufficient documentation

## 2012-09-14 DIAGNOSIS — E785 Hyperlipidemia, unspecified: Secondary | ICD-10-CM | POA: Insufficient documentation

## 2012-09-14 NOTE — Progress Notes (Signed)
Echocardiogram performed.  

## 2012-10-03 ENCOUNTER — Other Ambulatory Visit: Payer: Self-pay | Admitting: *Deleted

## 2012-10-03 MED ORDER — PIOGLITAZONE HCL 15 MG PO TABS: 15.0000 mg | ORAL_TABLET | Freq: Every day | ORAL | Status: DC

## 2012-10-03 MED ORDER — LEVOTHYROXINE SODIUM 75 MCG PO TABS: 75.0000 ug | ORAL_TABLET | Freq: Every day | ORAL | Status: DC

## 2012-10-10 ENCOUNTER — Other Ambulatory Visit: Payer: Self-pay | Admitting: *Deleted

## 2012-10-10 ENCOUNTER — Ambulatory Visit (HOSPITAL_BASED_OUTPATIENT_CLINIC_OR_DEPARTMENT_OTHER): Payer: Medicare Other | Admitting: Hematology & Oncology

## 2012-10-10 ENCOUNTER — Ambulatory Visit: Payer: Medicare Other

## 2012-10-10 ENCOUNTER — Other Ambulatory Visit (HOSPITAL_BASED_OUTPATIENT_CLINIC_OR_DEPARTMENT_OTHER): Payer: Medicare Other | Admitting: Lab

## 2012-10-10 VITALS — BP 149/75 | HR 72 | Temp 97.9°F | Resp 18 | Ht 63.0 in | Wt 169.0 lb

## 2012-10-10 DIAGNOSIS — D649 Anemia, unspecified: Secondary | ICD-10-CM

## 2012-10-10 LAB — CHCC SATELLITE - SMEAR

## 2012-10-10 LAB — CBC WITH DIFFERENTIAL (CANCER CENTER ONLY)
BASO%: 0.2 % (ref 0.0–2.0)
Eosinophils Absolute: 0 10*3/uL (ref 0.0–0.5)
HCT: 31.3 % — ABNORMAL LOW (ref 34.8–46.6)
LYMPH#: 1.2 10*3/uL (ref 0.9–3.3)
LYMPH%: 19.8 % (ref 14.0–48.0)
MCV: 97 fL (ref 81–101)
MONO#: 0.5 10*3/uL (ref 0.1–0.9)
NEUT%: 71.1 % (ref 39.6–80.0)
RBC: 3.22 10*6/uL — ABNORMAL LOW (ref 3.70–5.32)
RDW: 14.7 % (ref 11.1–15.7)
WBC: 6.2 10*3/uL (ref 3.9–10.0)

## 2012-10-10 MED ORDER — LEVOTHYROXINE SODIUM 75 MCG PO TABS: 75.0000 ug | ORAL_TABLET | Freq: Every day | ORAL | Status: DC

## 2012-10-10 MED ORDER — PIOGLITAZONE HCL 15 MG PO TABS: 15.0000 mg | ORAL_TABLET | Freq: Every day | ORAL | Status: DC

## 2012-10-10 NOTE — Progress Notes (Signed)
This office note has been dictated.

## 2012-10-10 NOTE — Telephone Encounter (Signed)
Escript refill did not go through last week. Resending.

## 2012-10-12 LAB — IRON AND TIBC
Iron: 89 ug/dL (ref 42–145)
UIBC: 280 ug/dL (ref 125–400)

## 2012-10-12 LAB — HEMOGLOBINOPATHY EVALUATION: Hemoglobin Other: 0 %

## 2012-10-12 LAB — PROTEIN ELECTROPHORESIS, SERUM, WITH REFLEX
Alpha-2-Globulin: 11.3 % (ref 7.1–11.8)
Beta Globulin: 7.1 % (ref 4.7–7.2)
Gamma Globulin: 10 % — ABNORMAL LOW (ref 11.1–18.8)

## 2012-10-12 LAB — ERYTHROPOIETIN: Erythropoietin: 16.4 m[IU]/mL (ref 2.6–18.5)

## 2012-10-12 LAB — RETICULOCYTES (CHCC): RBC.: 3.34 MIL/uL — ABNORMAL LOW (ref 3.87–5.11)

## 2012-10-12 NOTE — Progress Notes (Signed)
CC:   Sean A. Everardo All, MD Mauri Reading, MD, Fax [phone]  DIAGNOSIS:  Normochromic, normocytic anemia.  HISTORY OF PRESENT ILLNESS:  Ms. Dake is a very nice 67 year old white female.  She is followed by Dr. Romero Belling.  Her real problem right now is that she has severe back issues.  She had an MRI done actually last year which showed severe lower back issues with bad spinal stenosis.  She sees [facility] Leeanne [facility] at [facility].  He is a Midwife.  He is going to have to operate on her probably in a month or so.  Dr. Everardo All has been worried about Ms. Symanski having anemia.  On this MRI that she had done, I guess last year, there was some comment by the radiologist that said that there were some marrow abnormalities which could represent myeloproliferative issues.  This is very nonspecific.  Blood work that I have on her going back to March of this year showed a white cell count of 5.8, hemoglobin 10.8, hematocrit 31.9, and platelet count was 329.  MCV was 94.  She had a normal BUN and creatinine.  Total protein was 6.8 with albumin of 4.0.  She does have borderline diabetes.  She says that she has been anemic for quite a long time.  It has really never bothered her.  She does get routine maintenance exams.  She has had a colonoscopy within the past couple of years.  She did have a mammogram last May.  She has not noted any kind of rashes.  There has been no cough or shortness breath.  She has had no change in bowel or bladder habits.  Of note, she had a urinalysis back in March.  There was just a trace of protein.  Everything else looked okay.  She has not had any kind of medications given to her.  She is just mostly bothered by her poor back.  Again, she is going to have surgery in a month or so.  Again, we were kindly asked by Dr. Everardo All to see her so we could try to help evaluate the anemia.  She has not had any fever.  There is no weight loss or weight gain.  She is  on Synthroid for hypothyroidism.  She has had no dysphagia or odynophagia.  There has been no double vision or blurred vision.  There has been no joint aches or pains, outside of her lower back.  PAST MEDICAL HISTORY: 1. Borderline diabetes. 2. Hypothyroidism. 3. Hypertension. 4. Remote history of GI bleed. 5. Partial thyroidectomy for a goiter. 6. Right knee arthroscopy.  ALLERGIES: 1. Erythromycin. 2. Demerol.  MEDICATIONS:  Norvasc 2.5 mg p.o. daily, Coreg 6.25 mg p.o. b.i.d., Advil 200 mg as needed, Synthroid 0.075 mg p.o. daily, lisinopril 40 mg p.o. daily, Hyzaar (100/25) 1 p.o. daily, Mevacor 40 mg p.o. b.i.d., Actos 15 mg p.o. daily, Evista 60 mg p.o. daily, and she has had the shingles shot.  SOCIAL HISTORY:  Remarkable for past tobacco use.  She probably has less than a 20-pack-year history of tobacco use.  She has not smoked, I think, over 10 years.  There is no significant alcohol use.  FAMILY HISTORY:  Remarkable for I think her mother with a colonic polyp. There is no obvious history of anemia in the family as far as she knows.  REVIEW OF SYSTEMS:  As stated in history of present illness.  No additional findings are noted.  PHYSICAL EXAMINATION:  General:  This is a  well-developed, well- nourished white female in no obvious distress.  Vital signs: Temperature of 97.9, pulse 72, respiratory rate 18, blood pressure 149/75.  Weight is 169.  Head and neck:  Normocephalic, atraumatic skull.  There are no ocular or oral lesions.  There are no palpable cervical or supraclavicular lymph nodes.  Lungs:  Clear bilaterally. Cardiac:  Regular rate and rhythm with a normal S1 and S2.  There are no murmurs, rubs, or bruits.  Abdomen:  Soft with good bowel sounds.  There is no palpable abdominal mass.  There is no fluid wave.  There is no palpable hepatosplenomegaly.  Back:  No tenderness over the spine, ribs, or hips.  There is no kyphosis or osteoporotic changes.   Extremities: No clubbing, cyanosis, or edema.  Neurological:  No focal neurological deficits.  Skin:  No rashes, ecchymoses, or petechiae.  LABORATORY STUDIES:  White cell count is 6.2, hemoglobin 10.3, hematocrit 31.3, platelet count 306.  MCV is 97.  White cell differential shows 72 segs, 20 lymphs, 8 monos, 1 eosinophil.  Peripheral blood smear shows a normochromic, normocytic population of red blood cells.  There are no nucleated red blood cells.  I see no teardrop cells.  She has no rouleaux formation.  There are no inclusion bodies.  White cells appear normal in morphology and maturation.  I do not see any immature myeloid or lymphoid forms.  There are no hypersegmented polys.  There are no blasts.  Platelets are adequate in number and size.  Platelets are well granulated.  IMPRESSION:  Ms. Deupree is a very charming 67 year old white female with mild normochromic, normocytic anemia.  Her blood smear is very much unremarkable.  It will be interesting to see what her reticulocyte count is.  It also will be interesting to see what her erythropoietin level is.  She does have hyperglycemia.  This may or may not be a factor.  I absolutely have no idea what the MRI report indicates with this comment about the marrow appearance.  I just do not see any indication for a bone marrow biopsy right now.  I just think that would be a very low yield procedure.  Her main issue right now is her spinal stenosis.  This is what is causing her significant problems.  From my point of view, I do not see any issues with her having this surgery.  Again, her blood smear looks quite "bland."  We will see what the rest of her lab work looks like.  I would not think that she would be iron deficient, at least by her MCV and by her blood smear.  I also do not think that she would have macrocytic anemia.  I did send off an SPEP on her.  We will see what that looks like.  I spent a good hour or so  with Ms. Behan.  I did give her a prayer blanket.  She was very thankful for this.  I want try to get her back in about 3 months.  This will get her back after she has her surgery and is hopefully healing up from this and feeling well.    ______________________________ Josph Macho, M.D. PRE/MEDQ  D:  10/10/2012  T:  10/11/2012  Job:  [id number]

## 2012-10-26 HISTORY — PX: LUMBAR DISC SURGERY: SHX700

## 2012-11-16 ENCOUNTER — Other Ambulatory Visit: Payer: Self-pay

## 2012-11-16 MED ORDER — RALOXIFENE HCL 60 MG PO TABS: 60.0000 mg | ORAL_TABLET | Freq: Every day | ORAL | Status: DC

## 2012-11-18 ENCOUNTER — Other Ambulatory Visit: Payer: Self-pay | Admitting: *Deleted

## 2012-11-18 MED ORDER — RALOXIFENE HCL 60 MG PO TABS: 60.0000 mg | ORAL_TABLET | Freq: Every day | ORAL | Status: DC

## 2012-11-18 NOTE — Telephone Encounter (Signed)
Rx did not go through electronically. Resending.  

## 2012-12-01 ENCOUNTER — Other Ambulatory Visit: Payer: Self-pay | Admitting: Endocrinology

## 2012-12-19 ENCOUNTER — Other Ambulatory Visit: Payer: Self-pay | Admitting: Endocrinology

## 2012-12-19 DIAGNOSIS — Z139 Encounter for screening, unspecified: Secondary | ICD-10-CM

## 2013-01-02 ENCOUNTER — Other Ambulatory Visit: Payer: Self-pay | Admitting: *Deleted

## 2013-01-02 MED ORDER — LISINOPRIL 40 MG PO TABS: 40.0000 mg | ORAL_TABLET | Freq: Every day | ORAL | Status: DC

## 2013-01-02 MED ORDER — CARVEDILOL 6.25 MG PO TABS: 6.2500 mg | ORAL_TABLET | Freq: Two times a day (BID) | ORAL | Status: DC

## 2013-01-06 ENCOUNTER — Other Ambulatory Visit: Payer: Self-pay | Admitting: *Deleted

## 2013-01-06 DIAGNOSIS — D649 Anemia, unspecified: Secondary | ICD-10-CM

## 2013-01-09 ENCOUNTER — Other Ambulatory Visit (HOSPITAL_BASED_OUTPATIENT_CLINIC_OR_DEPARTMENT_OTHER): Payer: Medicare Other | Admitting: Lab

## 2013-01-09 ENCOUNTER — Ambulatory Visit (HOSPITAL_BASED_OUTPATIENT_CLINIC_OR_DEPARTMENT_OTHER): Payer: Medicare Other | Admitting: Hematology & Oncology

## 2013-01-09 VITALS — BP 124/75 | HR 73 | Temp 98.1°F | Resp 16 | Ht 63.0 in | Wt 169.0 lb

## 2013-01-09 DIAGNOSIS — D638 Anemia in other chronic diseases classified elsewhere: Secondary | ICD-10-CM

## 2013-01-09 DIAGNOSIS — D649 Anemia, unspecified: Secondary | ICD-10-CM

## 2013-01-09 NOTE — Progress Notes (Signed)
This office note has been dictated.

## 2013-01-10 LAB — CBC WITH DIFFERENTIAL (CANCER CENTER ONLY)
BASO#: 0 10*3/uL (ref 0.0–0.2)
Eosinophils Absolute: 0 10*3/uL (ref 0.0–0.5)
HGB: 11.2 g/dL — ABNORMAL LOW (ref 11.6–15.9)
LYMPH#: 1.2 10*3/uL (ref 0.9–3.3)
MCH: 32.3 pg (ref 26.0–34.0)
MONO#: 0.5 10*3/uL (ref 0.1–0.9)
MONO%: 9.9 % (ref 0.0–13.0)
NEUT#: 3.6 10*3/uL (ref 1.5–6.5)
RBC: 3.47 10*6/uL — ABNORMAL LOW (ref 3.70–5.32)
WBC: 5.4 10*3/uL (ref 3.9–10.0)

## 2013-01-10 NOTE — Progress Notes (Signed)
CC:   [name] All, MD  DIAGNOSIS:  Normochromic, normocytic anemia-chronic, multifactorial.  CURRENT THERAPY:  Observation.  INTERIM HISTORY:  [name] comes in for followup.  She is doing a whole lot better than when we last saw her.  She had spine surgery at [facility].  This helped her out quite a bit.  She feels a whole lot better now.  There is very little, if any pain.  We last saw her back in April.  At that point in time, all of her anemia studies came back fairly much unrevealing.  Her ferritin was 64 with iron saturation 24%.  She did not have a monoclonal spike on her SPEP. She had a normal hemoglobinopathy.  Erythropoietin level was 16.4.  She has had no problems with bleeding or bruising.  There has been no cough or shortness of breath.  PHYSICAL EXAMINATION:  General:  This is a well-developed, well- nourished white female in no obvious distress.  Vital signs: Temperature of 98.1, pulse 73, respiratory rate 16, blood pressure 124/75.  Weight is 169.  Head and neck:  Normocephalic, atraumatic skull.  There are no ocular or oral lesions.  There are no palpable cervical or supraclavicular lymph nodes.  Lungs:  Clear bilaterally. Cardiac:  Regular rate and rhythm with a normal S1 and S2.  There are no murmurs, rubs or bruits.  Abdomen:  Soft with good bowel sounds.  There is no palpable abdominal mass.  There is no fluid wave.  There is no palpable hepatosplenomegaly.  Extremities:  Show no clubbing, cyanosis or edema.  Neurological:  Shows no focal neurological deficits.  Skin: No rashes, ecchymoses or petechia.  LABORATORY STUDIES:  White cell count 5.4, hemoglobin 11.2, hematocrit 33.8, platelet count 309.  MCV is 97.  Peripheral smear shows a normochromic, normocytic population of red blood cells.  There are no schistocytes.  I see no rouleaux formation. There are no target cells.  I see no nucleated red blood cells.  White cells appear normal in  morphology and maturation.  There are no hypersegmented polys.  There are no immature myeloid or lymphoid forms. Platelets are adequate in number and size.  IMPRESSION:  [name] is a very charming 67 year old white female with mild anemia.  Her studies looked unremarkable. From my point of view, she is asymptomatic.  The anemia is minimal at best.  All of her studies looked okay. I really do not think we have to get [name] back to the clinic.  We will certainly see her back if any problems do arise.  I suspect that her anemia may fluctuate a little bit.  Again, she is asymptomatic with this.    ______________________________ Josph Macho, M.D. PRE/MEDQ  D:  01/09/2013  T:  01/10/2013  Job:  [id number]

## 2013-04-18 ENCOUNTER — Ambulatory Visit: Payer: Self-pay

## 2013-04-25 ENCOUNTER — Ambulatory Visit (INDEPENDENT_AMBULATORY_CARE_PROVIDER_SITE_OTHER): Payer: Medicare Other

## 2013-04-25 DIAGNOSIS — Z1231 Encounter for screening mammogram for malignant neoplasm of breast: Secondary | ICD-10-CM

## 2013-04-25 DIAGNOSIS — Z139 Encounter for screening, unspecified: Secondary | ICD-10-CM

## 2013-06-30 ENCOUNTER — Other Ambulatory Visit: Payer: Self-pay

## 2013-06-30 ENCOUNTER — Other Ambulatory Visit: Payer: Self-pay | Admitting: Endocrinology

## 2013-06-30 MED ORDER — LOSARTAN POTASSIUM-HCTZ 100-25 MG PO TABS: 1.0000 | ORAL_TABLET | Freq: Every day | ORAL | Status: DC

## 2013-09-01 ENCOUNTER — Encounter: Payer: Self-pay | Admitting: Endocrinology

## 2013-09-01 ENCOUNTER — Ambulatory Visit (INDEPENDENT_AMBULATORY_CARE_PROVIDER_SITE_OTHER): Payer: Medicare Other | Admitting: Endocrinology

## 2013-09-01 ENCOUNTER — Ambulatory Visit (INDEPENDENT_AMBULATORY_CARE_PROVIDER_SITE_OTHER): Payer: Medicare Other | Admitting: *Deleted

## 2013-09-01 VITALS — BP 126/60 | HR 80 | Temp 97.9°F | Ht 63.0 in | Wt 170.0 lb

## 2013-09-01 DIAGNOSIS — E890 Postprocedural hypothyroidism: Secondary | ICD-10-CM

## 2013-09-01 DIAGNOSIS — Z0000 Encounter for general adult medical examination without abnormal findings: Secondary | ICD-10-CM

## 2013-09-01 DIAGNOSIS — Z23 Encounter for immunization: Secondary | ICD-10-CM

## 2013-09-01 DIAGNOSIS — M545 Low back pain, unspecified: Secondary | ICD-10-CM

## 2013-09-01 DIAGNOSIS — E876 Hypokalemia: Secondary | ICD-10-CM

## 2013-09-01 DIAGNOSIS — E7800 Pure hypercholesterolemia, unspecified: Secondary | ICD-10-CM

## 2013-09-01 DIAGNOSIS — D649 Anemia, unspecified: Secondary | ICD-10-CM

## 2013-09-01 DIAGNOSIS — E119 Type 2 diabetes mellitus without complications: Secondary | ICD-10-CM

## 2013-09-01 DIAGNOSIS — I10 Essential (primary) hypertension: Secondary | ICD-10-CM

## 2013-09-01 LAB — URINALYSIS, ROUTINE W REFLEX MICROSCOPIC
Hgb urine dipstick: NEGATIVE
KETONES UR: NEGATIVE
Leukocytes, UA: NEGATIVE
Nitrite: NEGATIVE
PH: 6 (ref 5.0–8.0)
Specific Gravity, Urine: 1.02 (ref 1.000–1.030)
TOTAL PROTEIN, URINE-UPE24: NEGATIVE
Urine Glucose: NEGATIVE
Urobilinogen, UA: 0.2 (ref 0.0–1.0)

## 2013-09-01 LAB — LIPID PANEL
Cholesterol: 173 mg/dL (ref 0–200)
HDL: 89.4 mg/dL (ref >39.00)
LDL Cholesterol: 63 mg/dL (ref 0–99)
Total CHOL/HDL Ratio: 2
Triglycerides: 102 mg/dL (ref 0.0–149.0)
VLDL: 20.4 mg/dL (ref 0.0–40.0)

## 2013-09-01 LAB — MICROALBUMIN / CREATININE URINE RATIO
Creatinine,U: 292.6 mg/dL
Microalb Creat Ratio: 1.6 mg/g (ref 0.0–30.0)
Microalb, Ur: 4.7 mg/dL — ABNORMAL HIGH (ref 0.0–1.9)

## 2013-09-01 LAB — CBC WITH DIFFERENTIAL/PLATELET
BASOS PCT: 0.2 % (ref 0.0–3.0)
Basophils Absolute: 0 10*3/uL (ref 0.0–0.1)
EOS ABS: 0 10*3/uL (ref 0.0–0.7)
Eosinophils Relative: 0.5 % (ref 0.0–5.0)
HCT: 33.5 % — ABNORMAL LOW (ref 36.0–46.0)
Hemoglobin: 11.2 g/dL — ABNORMAL LOW (ref 12.0–15.0)
Lymphocytes Relative: 19.8 % (ref 12.0–46.0)
Lymphs Abs: 1.1 10*3/uL (ref 0.7–4.0)
MCHC: 33.4 g/dL (ref 30.0–36.0)
MCV: 97.4 fl (ref 78.0–100.0)
MONO ABS: 0.5 10*3/uL (ref 0.1–1.0)
Monocytes Relative: 8.5 % (ref 3.0–12.0)
NEUTROS PCT: 71 % (ref 43.0–77.0)
Neutro Abs: 3.9 10*3/uL (ref 1.4–7.7)
Platelets: 278 10*3/uL (ref 150.0–400.0)
RBC: 3.43 Mil/uL — AB (ref 3.87–5.11)
RDW: 15.2 % — ABNORMAL HIGH (ref 11.5–14.6)
WBC: 5.6 10*3/uL (ref 4.5–10.5)

## 2013-09-01 LAB — IBC PANEL
Iron: 73 ug/dL (ref 42–145)
Saturation Ratios: 18.6 % — ABNORMAL LOW (ref 20.0–50.0)
TRANSFERRIN: 280.5 mg/dL (ref 212.0–360.0)

## 2013-09-01 LAB — BASIC METABOLIC PANEL
BUN: 21 mg/dL (ref 6–23)
CALCIUM: 8.9 mg/dL (ref 8.4–10.5)
CO2: 26 mEq/L (ref 19–32)
Chloride: 100 mEq/L (ref 96–112)
Creatinine, Ser: 0.9 mg/dL (ref 0.4–1.2)
GFR: 66.01 mL/min (ref >60.00)
GLUCOSE: 107 mg/dL — AB (ref 70–99)
Potassium: 3.1 mEq/L — ABNORMAL LOW (ref 3.5–5.1)
SODIUM: 135 meq/L (ref 135–145)

## 2013-09-01 LAB — HEPATIC FUNCTION PANEL
ALBUMIN: 4.2 g/dL (ref 3.5–5.2)
ALK PHOS: 44 U/L (ref 39–117)
ALT: 13 U/L (ref 0–35)
AST: 21 U/L (ref 0–37)
Bilirubin, Direct: 0 mg/dL (ref 0.0–0.3)
Total Bilirubin: 0.5 mg/dL (ref 0.3–1.2)
Total Protein: 7 g/dL (ref 6.0–8.3)

## 2013-09-01 LAB — HEMOGLOBIN A1C: Hgb A1c MFr Bld: 6 % (ref 4.6–6.5)

## 2013-09-01 LAB — TSH: TSH: 0.15 u[IU]/mL — ABNORMAL LOW (ref 0.35–5.50)

## 2013-09-01 MED ORDER — PNEUMOCOCCAL 13-VAL CONJ VACC IM SUSP: 0.5000 mL | INTRAMUSCULAR | Status: DC

## 2013-09-01 NOTE — Patient Instructions (Addendum)
If you decide to get the nodule checked, please call the old office at [phone], and ask for an appointment with Dr Alain Marion.   blood tests are being requested for you today.  We'll contact you with results. please consider these measures for your health:  minimize alcohol.  do not use tobacco products.  have a colonoscopy at least every 10 years from age 69.  Women should have an annual mammogram from age 25.  keep firearms safely stored.  always use seat belts.  have working smoke alarms in your home.  see an eye doctor and dentist regularly.  never drive under the influence of alcohol or drugs (including prescription drugs).  those with fair skin should take precautions against the sun. Please come back for a follow-up appointment in 6 months. Please stop taking the lisinopril.

## 2013-09-01 NOTE — Progress Notes (Signed)
Subjective:    Patient ID: [name], female    DOB: 10-07-1945, 68 y.o.   MRN: [id number]  HPI Pt is here for regular wellness examination, and is feeling pretty well in general, and says chronic med probs are stable, except as noted below Past Medical History  Diagnosis Date  . Blood transfusion     for GI bleed 1987  . Arthritis   . Diabetes mellitus   . Thyroid disease   . Hypertension     Past Surgical History  Procedure Laterality Date  . Thyroidectomy, partial    . Rt knee arthroscopy      History   Social History  . Marital Status: Married    Spouse Name: N/A    Number of Children: N/A  . Years of Education: N/A   Occupational History  . Not on file.   Social History Main Topics  . Smoking status: Former Smoker    Quit date: 09/09/1986  . Smokeless tobacco: Not on file  . Alcohol Use: 7.0 oz/week    14 drink(s) per week  . Drug Use: No  . Sexual Activity: Not on file   Other Topics Concern  . Not on file   Social History Narrative  . No narrative on file    Current Outpatient Prescriptions on File Prior to Visit  Medication Sig Dispense Refill  . amLODipine (NORVASC) 2.5 MG tablet Take 1 tablet by mouth  daily  90 tablet  2  . carvedilol (COREG) 6.25 MG tablet Take 1 tablet (6.25 mg total) by mouth 2 (two) times daily with a meal.  180 tablet  1  . ibuprofen (ADVIL,MOTRIN) 200 MG tablet 200 mg daily. 3 tablets every morning      . losartan-hydrochlorothiazide (HYZAAR) 100-25 MG per tablet Take 1 tablet by mouth daily.  90 tablet  0  . lovastatin (MEVACOR) 40 MG tablet Take 1 tablet (40 mg total) by mouth 2 (two) times daily.  180 tablet  3  . pioglitazone (ACTOS) 15 MG tablet Take 1 tablet (15 mg total) by mouth daily.  90 tablet  3  . raloxifene (EVISTA) 60 MG tablet Take 1 tablet (60 mg total) by mouth daily.  90 tablet  3  . RESTASIS 0.05 % ophthalmic emulsion Place 1 drop into both eyes every 12 (twelve) hours.        No current  facility-administered medications on file prior to visit.    Allergies  Allergen Reactions  . Erythromycin     REACTION: Rash  . Meperidine Hcl     REACTION: Nausea    Family History  Problem Relation Age of Onset  . Colon cancer Mother     BP 126/60  Pulse 80  Temp(Src) 97.9 F (36.6 C) (Oral)  Ht 5\' 3"  (1.6 m)  Wt 170 lb (77.111 kg)  BMI 30.12 kg/m2  SpO2 96%  Review of Systems  Constitutional: Negative for fever.  HENT: Negative for hearing loss.   Eyes: Negative for visual disturbance.  Respiratory: Negative for shortness of breath.   Cardiovascular: Negative for chest pain.  Gastrointestinal: Negative for anal bleeding.  Endocrine: Negative for cold intolerance.  Genitourinary: Negative for hematuria.  Musculoskeletal: Negative for gait problem.  Skin: Negative for rash.  Allergic/Immunologic: Negative for environmental allergies.  Neurological: Negative for syncope.  Hematological: Does not bruise/bleed easily.  Psychiatric/Behavioral: Negative for dysphoric mood.       Objective:   Physical Exam VS: see vs page GEN: no  distress HEAD: head: no deformity eyes: no periorbital swelling, no proptosis external nose and ears are normal mouth: no lesion seen CHEST WALL: no deformity LUNGS:  Clear to auscultation BREASTS:  sees gyn CV: reg rate and rhythm, no murmur ABD: abdomen is soft, nontender.  no hepatosplenomegaly.  not distended.  no hernia GENITALIA/RECTAL: sees gyn MUSCULOSKELETAL: muscle bulk and strength are grossly normal.  no obvious joint swelling.  gait is normal and steady PULSES:  no carotid bruit NEURO:  cn 2-12 grossly intact.   readily moves all 4's.  SKIN:  Normal texture and temperature.  No rash or suspicious lesion is visible.   NODES:  None palpable at the neck PSYCH: alert, well-oriented.  Does not appear anxious nor depressed.     i reviewed electrocardiogram: unchanged.    Assessment & Plan:  Wellness visit today, with  problems stable, except as noted.    SEPARATE EVALUATION FOLLOWS--EACH PROBLEM HERE IS NEW, NOT RESPONDING TO TREATMENT, OR POSES SIGNIFICANT RISK TO THE PATIENT'S HEALTH: HISTORY OF THE PRESENT ILLNESS: The state of at least three ongoing medical problems is addressed today, with interval history of each noted here: Anemia: denies brbpr Hypothyroidism: she had right lobectomy 2003.  She has been on synthroid since then.   Hypokalemia: she has not required medication in the past [place] reviewed and up to date today REVIEW OF SYSTEMS: Denies weight change and numbness PHYSICAL EXAMINATION: VITAL SIGNS:  See vs page Neck: a healed scar is present.  i do not appreciate a nodule in the thyroid or elsewhere in the neck GENERAL: no distress LAB/XRAY RESULTS: Lab Results  Component Value Date   WBC 5.6 09/01/2013   HGB 11.2* 09/01/2013   HCT 33.5* 09/01/2013   MCV 97.4 09/01/2013   PLT 278.0 09/01/2013   Lab Results  Component Value Date   TSH 0.15* 09/01/2013   Lab Results  Component Value Date   CREATININE 0.9 09/01/2013   BUN 21 09/01/2013   NA 135 09/01/2013   K 3.1* 09/01/2013   CL 100 09/01/2013   CO2 26 09/01/2013  IMPRESSION: Hypokalemia: she needs increased rx Hypothyroidism: slightly overreplaced Anemia: she needs increased rx PLAN: See instruction page

## 2013-09-02 DIAGNOSIS — E876 Hypokalemia: Secondary | ICD-10-CM | POA: Insufficient documentation

## 2013-09-02 MED ORDER — LEVOTHYROXINE SODIUM 50 MCG PO TABS: 50.0000 ug | ORAL_TABLET | Freq: Every day | ORAL | Status: DC

## 2013-09-02 MED ORDER — POTASSIUM CHLORIDE ER 10 MEQ PO TBCR: 10.0000 meq | EXTENDED_RELEASE_TABLET | Freq: Every day | ORAL | Status: DC

## 2013-09-05 ENCOUNTER — Other Ambulatory Visit: Payer: Self-pay

## 2013-09-05 MED ORDER — LOVASTATIN 40 MG PO TABS: 40.0000 mg | ORAL_TABLET | Freq: Two times a day (BID) | ORAL | Status: DC

## 2013-09-05 MED ORDER — RALOXIFENE HCL 60 MG PO TABS: 60.0000 mg | ORAL_TABLET | Freq: Every day | ORAL | Status: DC

## 2013-09-11 ENCOUNTER — Other Ambulatory Visit: Payer: Self-pay | Admitting: Endocrinology

## 2013-09-12 ENCOUNTER — Other Ambulatory Visit: Payer: Self-pay

## 2013-10-11 ENCOUNTER — Other Ambulatory Visit (INDEPENDENT_AMBULATORY_CARE_PROVIDER_SITE_OTHER): Payer: Medicare Other

## 2013-10-11 DIAGNOSIS — E876 Hypokalemia: Secondary | ICD-10-CM

## 2013-10-11 DIAGNOSIS — D649 Anemia, unspecified: Secondary | ICD-10-CM

## 2013-10-11 DIAGNOSIS — E890 Postprocedural hypothyroidism: Secondary | ICD-10-CM

## 2013-10-11 LAB — BASIC METABOLIC PANEL
BUN: 17 mg/dL (ref 6–23)
CHLORIDE: 100 meq/L (ref 96–112)
CO2: 29 meq/L (ref 19–32)
Calcium: 8.7 mg/dL (ref 8.4–10.5)
Creatinine, Ser: 0.8 mg/dL (ref 0.4–1.2)
GFR: 81.44 mL/min (ref >60.00)
Glucose, Bld: 93 mg/dL (ref 70–99)
Potassium: 3.3 mEq/L — ABNORMAL LOW (ref 3.5–5.1)
Sodium: 135 mEq/L (ref 135–145)

## 2013-10-11 LAB — CBC WITH DIFFERENTIAL/PLATELET
Basophils Absolute: 0 10*3/uL (ref 0.0–0.1)
Basophils Relative: 0.4 % (ref 0.0–3.0)
EOS ABS: 0 10*3/uL (ref 0.0–0.7)
EOS PCT: 0.7 % (ref 0.0–5.0)
HEMATOCRIT: 32.1 % — AB (ref 36.0–46.0)
Hemoglobin: 10.8 g/dL — ABNORMAL LOW (ref 12.0–15.0)
LYMPHS ABS: 1.3 10*3/uL (ref 0.7–4.0)
Lymphocytes Relative: 22.1 % (ref 12.0–46.0)
MCHC: 33.5 g/dL (ref 30.0–36.0)
MCV: 98.2 fl (ref 78.0–100.0)
MONO ABS: 0.5 10*3/uL (ref 0.1–1.0)
Monocytes Relative: 8.8 % (ref 3.0–12.0)
NEUTROS PCT: 68 % (ref 43.0–77.0)
Neutro Abs: 4 10*3/uL (ref 1.4–7.7)
PLATELETS: 321 10*3/uL (ref 150.0–400.0)
RBC: 3.28 Mil/uL — ABNORMAL LOW (ref 3.87–5.11)
RDW: 15.4 % — ABNORMAL HIGH (ref 11.5–14.6)
WBC: 5.8 10*3/uL (ref 4.5–10.5)

## 2013-10-11 LAB — IBC PANEL
Iron: 64 ug/dL (ref 42–145)
Saturation Ratios: 18.2 % — ABNORMAL LOW (ref 20.0–50.0)
TRANSFERRIN: 251.6 mg/dL (ref 212.0–360.0)

## 2013-10-11 LAB — TSH: TSH: 0.69 u[IU]/mL (ref 0.35–5.50)

## 2014-02-15 LAB — HM DIABETES EYE EXAM

## 2014-03-28 ENCOUNTER — Telehealth: Payer: Self-pay

## 2014-03-28 NOTE — Telephone Encounter (Signed)
Left a message for call back.  Called patient regarding diabetic eye exam.  When patient calls back please ask:  Have you had a recent (2014-2015) eye exam?    Date of Exam?  Where?    

## 2014-04-03 ENCOUNTER — Other Ambulatory Visit: Payer: Self-pay | Admitting: Endocrinology

## 2014-04-03 DIAGNOSIS — Z139 Encounter for screening, unspecified: Secondary | ICD-10-CM

## 2014-04-06 ENCOUNTER — Other Ambulatory Visit: Payer: Self-pay

## 2014-04-19 ENCOUNTER — Encounter: Payer: Self-pay | Admitting: Endocrinology

## 2014-04-23 ENCOUNTER — Encounter: Payer: Self-pay | Admitting: Endocrinology

## 2014-04-23 ENCOUNTER — Ambulatory Visit (INDEPENDENT_AMBULATORY_CARE_PROVIDER_SITE_OTHER): Payer: Medicare Other | Admitting: Endocrinology

## 2014-04-23 VITALS — BP 130/84 | HR 82 | Temp 98.0°F | Ht 63.0 in | Wt 174.0 lb

## 2014-04-23 DIAGNOSIS — E890 Postprocedural hypothyroidism: Secondary | ICD-10-CM

## 2014-04-23 DIAGNOSIS — E119 Type 2 diabetes mellitus without complications: Secondary | ICD-10-CM

## 2014-04-23 DIAGNOSIS — R739 Hyperglycemia, unspecified: Secondary | ICD-10-CM

## 2014-04-23 DIAGNOSIS — Z23 Encounter for immunization: Secondary | ICD-10-CM

## 2014-04-23 DIAGNOSIS — E876 Hypokalemia: Secondary | ICD-10-CM

## 2014-04-23 DIAGNOSIS — D649 Anemia, unspecified: Secondary | ICD-10-CM

## 2014-04-23 LAB — BASIC METABOLIC PANEL
BUN: 19 mg/dL (ref 6–23)
CHLORIDE: 99 meq/L (ref 96–112)
CO2: 26 meq/L (ref 19–32)
CREATININE: 0.9 mg/dL (ref 0.4–1.2)
Calcium: 9.2 mg/dL (ref 8.4–10.5)
GFR: 69.43 mL/min (ref >60.00)
Glucose, Bld: 88 mg/dL (ref 70–99)
Potassium: 3.3 mEq/L — ABNORMAL LOW (ref 3.5–5.1)
Sodium: 133 mEq/L — ABNORMAL LOW (ref 135–145)

## 2014-04-23 LAB — CBC WITH DIFFERENTIAL/PLATELET
BASOS ABS: 0 10*3/uL (ref 0.0–0.1)
Basophils Relative: 0.2 % (ref 0.0–3.0)
Eosinophils Absolute: 0 10*3/uL (ref 0.0–0.7)
Eosinophils Relative: 0.7 % (ref 0.0–5.0)
HEMATOCRIT: 33.7 % — AB (ref 36.0–46.0)
HEMOGLOBIN: 11.4 g/dL — AB (ref 12.0–15.0)
LYMPHS ABS: 1.4 10*3/uL (ref 0.7–4.0)
Lymphocytes Relative: 21 % (ref 12.0–46.0)
MCHC: 33.8 g/dL (ref 30.0–36.0)
MCV: 94.8 fl (ref 78.0–100.0)
MONO ABS: 0.5 10*3/uL (ref 0.1–1.0)
Monocytes Relative: 7 % (ref 3.0–12.0)
NEUTROS ABS: 4.8 10*3/uL (ref 1.4–7.7)
Neutrophils Relative %: 71.1 % (ref 43.0–77.0)
Platelets: 279 10*3/uL (ref 150.0–400.0)
RBC: 3.56 Mil/uL — ABNORMAL LOW (ref 3.87–5.11)
RDW: 15.4 % (ref 11.5–15.5)
WBC: 6.7 10*3/uL (ref 4.0–10.5)

## 2014-04-23 LAB — IBC PANEL
Iron: 74 ug/dL (ref 42–145)
Saturation Ratios: 19.9 % — ABNORMAL LOW (ref 20.0–50.0)
Transferrin: 265.4 mg/dL (ref 212.0–360.0)

## 2014-04-23 LAB — HEMOGLOBIN A1C: Hgb A1c MFr Bld: 5.8 % (ref 4.6–6.5)

## 2014-04-23 LAB — TSH: TSH: 0.68 u[IU]/mL (ref 0.35–4.50)

## 2014-04-23 MED ORDER — SCOPOLAMINE 1 MG/3DAYS TD PT72: 1.0000 | MEDICATED_PATCH | TRANSDERMAL | Status: DC

## 2014-04-23 NOTE — Progress Notes (Signed)
Subjective:    Patient ID: [name], female    DOB: 23-Oct-1945, 68 y.o.   MRN: [id number]  HPI The state of at least three ongoing medical problems is addressed today, with interval history of each noted here: hyperglycemia Anemia Hypokalemia She will go on a cruise soon Past Medical History  Diagnosis Date  . Blood transfusion     for GI bleed 1987  . Arthritis   . Diabetes mellitus   . Thyroid disease   . Hypertension     Past Surgical History  Procedure Laterality Date  . Thyroidectomy, partial    . Rt knee arthroscopy      History   Social History  . Marital Status: Married    Spouse Name: N/A    Number of Children: N/A  . Years of Education: N/A   Occupational History  . Not on file.   Social History Main Topics  . Smoking status: Former Smoker    Quit date: 09/09/1986  . Smokeless tobacco: Not on file  . Alcohol Use: 7.0 oz/week    14 drink(s) per week  . Drug Use: No  . Sexual Activity: Not on file   Other Topics Concern  . Not on file   Social History Narrative    Current Outpatient Prescriptions on File Prior to Visit  Medication Sig Dispense Refill  . amLODipine (NORVASC) 2.5 MG tablet Take 1 tablet by mouth   daily 90 tablet 2  . carvedilol (COREG) 6.25 MG tablet Take 1 tablet (6.25 mg  total) by mouth 2 (two)  times daily with a meal. 180 tablet 2  . ibuprofen (ADVIL,MOTRIN) 200 MG tablet 200 mg daily. 3 tablets every morning    . levothyroxine (SYNTHROID, LEVOTHROID) 50 MCG tablet Take 1 tablet (50 mcg total) by mouth daily before breakfast. 90 tablet 3  . losartan-hydrochlorothiazide (HYZAAR) 100-25 MG per tablet Take 1 tablet by mouth  daily 90 tablet 2  . lovastatin (MEVACOR) 40 MG tablet Take 1 tablet (40 mg total) by mouth 2 (two) times daily. 180 tablet 3  . pioglitazone (ACTOS) 15 MG tablet Take 1 tablet by mouth  daily 90 tablet 2  . raloxifene (EVISTA) 60 MG tablet Take 1 tablet (60 mg total) by mouth daily. 90 tablet 3  .  RESTASIS 0.05 % ophthalmic emulsion Place 1 drop into both eyes every 12 (twelve) hours.      No current facility-administered medications on file prior to visit.    Allergies  Allergen Reactions  . Erythromycin     REACTION: Rash  . Meperidine Hcl     REACTION: Nausea    Family History  Problem Relation Age of Onset  . Colon cancer Mother     BP 130/84 mmHg  Pulse 82  Temp(Src) 98 F (36.7 C) (Oral)  Ht 5\' 3"  (1.6 m)  Wt 174 lb (78.926 kg)  BMI 30.83 kg/m2  SpO2 95%   Review of Systems Denies edema    Objective:   Physical Exam VITAL SIGNS:  See vs page GENERAL: no distress Pulses: dorsalis pedis intact bilat.   Feet: no deformity.  no edema Skin:  no ulcer on the feet.  normal color and temp. Neuro: sensation is intact to touch on the feet   Lab Results  Component Value Date   HGBA1C 5.8 04/23/2014   Lab Results  Component Value Date   CREATININE 0.9 04/23/2014   BUN 19 04/23/2014   NA 133* 04/23/2014   K  3.3* 04/23/2014   CL 99 04/23/2014   CO2 26 04/23/2014   Lab Results  Component Value Date   WBC 6.7 04/23/2014   HGB 11.4* 04/23/2014   HCT 33.7* 04/23/2014   MCV 94.8 04/23/2014   PLT 279.0 04/23/2014  '     Assessment & Plan:  Hypokalemia, persistent Anemia: persistent Hyperglycemia: stable. Travel: i have sent a prescription, for scopolamine patches.     Patient is advised the following: Patient Instructions  blood tests are being requested for you today.  We'll contact you with results. Please come back for a follow-up appointment in 6 months (must be after 09/02/14).

## 2014-04-23 NOTE — Patient Instructions (Addendum)
blood tests are being requested for you today.  We'll contact you with results. Please come back for a follow-up appointment in 6 months (must be after 09/02/14).

## 2014-04-24 ENCOUNTER — Other Ambulatory Visit: Payer: Self-pay | Admitting: Endocrinology

## 2014-04-24 DIAGNOSIS — R739 Hyperglycemia, unspecified: Secondary | ICD-10-CM | POA: Insufficient documentation

## 2014-04-25 ENCOUNTER — Other Ambulatory Visit: Payer: Self-pay

## 2014-04-25 MED ORDER — AMLODIPINE BESYLATE 2.5 MG PO TABS: ORAL_TABLET | ORAL | Status: DC

## 2014-04-26 NOTE — Telephone Encounter (Signed)
Eye Exam was due on August 27th at Dr. Richardean Sale office.  Called office and left a message.

## 2014-05-02 ENCOUNTER — Ambulatory Visit (INDEPENDENT_AMBULATORY_CARE_PROVIDER_SITE_OTHER): Payer: Medicare Other

## 2014-05-02 DIAGNOSIS — Z139 Encounter for screening, unspecified: Secondary | ICD-10-CM

## 2014-05-02 DIAGNOSIS — Z1231 Encounter for screening mammogram for malignant neoplasm of breast: Secondary | ICD-10-CM

## 2014-07-23 ENCOUNTER — Other Ambulatory Visit: Payer: Self-pay | Admitting: Endocrinology

## 2014-08-10 ENCOUNTER — Encounter: Payer: Self-pay | Admitting: Endocrinology

## 2014-08-10 ENCOUNTER — Telehealth: Payer: Self-pay

## 2014-08-10 ENCOUNTER — Other Ambulatory Visit: Payer: Self-pay | Admitting: Endocrinology

## 2014-08-10 MED ORDER — POTASSIUM CHLORIDE ER 10 MEQ PO TBCR: 10.0000 meq | EXTENDED_RELEASE_TABLET | Freq: Two times a day (BID) | ORAL | Status: DC

## 2014-08-10 NOTE — Telephone Encounter (Signed)
ERROR

## 2014-09-05 ENCOUNTER — Ambulatory Visit (INDEPENDENT_AMBULATORY_CARE_PROVIDER_SITE_OTHER): Payer: Medicare Other | Admitting: Endocrinology

## 2014-09-05 ENCOUNTER — Encounter: Payer: Self-pay | Admitting: Endocrinology

## 2014-09-05 VITALS — BP 124/60 | HR 76 | Temp 98.2°F | Wt 170.0 lb

## 2014-09-05 DIAGNOSIS — I10 Essential (primary) hypertension: Secondary | ICD-10-CM | POA: Diagnosis not present

## 2014-09-05 DIAGNOSIS — Z780 Asymptomatic menopausal state: Secondary | ICD-10-CM

## 2014-09-05 DIAGNOSIS — E7800 Pure hypercholesterolemia, unspecified: Secondary | ICD-10-CM

## 2014-09-05 DIAGNOSIS — Z0000 Encounter for general adult medical examination without abnormal findings: Secondary | ICD-10-CM | POA: Diagnosis not present

## 2014-09-05 DIAGNOSIS — D649 Anemia, unspecified: Secondary | ICD-10-CM | POA: Diagnosis not present

## 2014-09-05 DIAGNOSIS — E876 Hypokalemia: Secondary | ICD-10-CM | POA: Diagnosis not present

## 2014-09-05 LAB — LIPID PANEL
CHOL/HDL RATIO: 2
Cholesterol: 178 mg/dL (ref 0–200)
HDL: 73.9 mg/dL (ref >39.00)
LDL Cholesterol: 90 mg/dL (ref 0–99)
NONHDL: 104.1
Triglycerides: 70 mg/dL (ref 0.0–149.0)
VLDL: 14 mg/dL (ref 0.0–40.0)

## 2014-09-05 LAB — CBC WITH DIFFERENTIAL/PLATELET
BASOS ABS: 0 10*3/uL (ref 0.0–0.1)
Basophils Relative: 0.3 % (ref 0.0–3.0)
Eosinophils Absolute: 0 10*3/uL (ref 0.0–0.7)
Eosinophils Relative: 0.6 % (ref 0.0–5.0)
HCT: 32.3 % — ABNORMAL LOW (ref 36.0–46.0)
Hemoglobin: 11.1 g/dL — ABNORMAL LOW (ref 12.0–15.0)
LYMPHS ABS: 1.1 10*3/uL (ref 0.7–4.0)
LYMPHS PCT: 18.8 % (ref 12.0–46.0)
MCHC: 34.3 g/dL (ref 30.0–36.0)
MCV: 94.3 fl (ref 78.0–100.0)
Monocytes Absolute: 0.5 10*3/uL (ref 0.1–1.0)
Monocytes Relative: 9.1 % (ref 3.0–12.0)
Neutro Abs: 4.1 10*3/uL (ref 1.4–7.7)
Neutrophils Relative %: 71.2 % (ref 43.0–77.0)
Platelets: 273 10*3/uL (ref 150.0–400.0)
RBC: 3.42 Mil/uL — ABNORMAL LOW (ref 3.87–5.11)
RDW: 15.5 % (ref 11.5–15.5)
WBC: 5.7 10*3/uL (ref 4.0–10.5)

## 2014-09-05 LAB — BASIC METABOLIC PANEL
BUN: 18 mg/dL (ref 6–23)
CALCIUM: 9.1 mg/dL (ref 8.4–10.5)
CO2: 29 mEq/L (ref 19–32)
CREATININE: 0.77 mg/dL (ref 0.40–1.20)
Chloride: 102 mEq/L (ref 96–112)
GFR: 78.8 mL/min (ref >60.00)
Glucose, Bld: 121 mg/dL — ABNORMAL HIGH (ref 70–99)
Potassium: 3.3 mEq/L — ABNORMAL LOW (ref 3.5–5.1)
SODIUM: 136 meq/L (ref 135–145)

## 2014-09-05 LAB — HEMOGLOBIN A1C: Hgb A1c MFr Bld: 6 % (ref 4.6–6.5)

## 2014-09-05 LAB — IBC PANEL
Iron: 94 ug/dL (ref 42–145)
SATURATION RATIOS: 28 % (ref 20.0–50.0)
Transferrin: 240 mg/dL (ref 212.0–360.0)

## 2014-09-05 NOTE — Progress Notes (Signed)
Subjective:    Patient ID: [name], female    DOB: 11-26-1945, 69 y.o.   MRN: [id number]  HPI Pt is here for regular wellness examination, and is feeling pretty well in general, and says chronic med probs are stable, except as noted below Past Medical History  Diagnosis Date  . Blood transfusion     for GI bleed 1987  . Arthritis   . Diabetes mellitus   . Thyroid disease   . Hypertension     Past Surgical History  Procedure Laterality Date  . Thyroidectomy, partial    . Rt knee arthroscopy      History   Social History  . Marital Status: Married    Spouse Name: N/A  . Number of Children: N/A  . Years of Education: N/A   Occupational History  . Not on file.   Social History Main Topics  . Smoking status: Former Smoker    Quit date: 09/09/1986  . Smokeless tobacco: Not on file  . Alcohol Use: 7.0 oz/week    14 drink(s) per week  . Drug Use: No  . Sexual Activity: Not on file   Other Topics Concern  . Not on file   Social History Narrative    Current Outpatient Prescriptions on File Prior to Visit  Medication Sig Dispense Refill  . amLODipine (NORVASC) 2.5 MG tablet Take 1 tablet by mouth   daily 90 tablet 2  . carvedilol (COREG) 6.25 MG tablet Take 1 tablet by mouth  twice a day with meals 180 tablet 2  . ibuprofen (ADVIL,MOTRIN) 200 MG tablet 200 mg daily. 3 tablets every morning    . levothyroxine (SYNTHROID, LEVOTHROID) 50 MCG tablet Take 1 tablet by mouth  daily before breakfast 90 tablet 2  . losartan-hydrochlorothiazide (HYZAAR) 100-25 MG per tablet Take 1 tablet by mouth  daily 90 tablet 2  . lovastatin (MEVACOR) 40 MG tablet Take 1 tablet (40 mg total) by mouth 2 (two) times daily. 180 tablet 3  . pioglitazone (ACTOS) 15 MG tablet Take 1 tablet by mouth  daily 90 tablet 1  . raloxifene (EVISTA) 60 MG tablet Take 1 tablet by mouth  daily 90 tablet 2  . RESTASIS 0.05 % ophthalmic emulsion Place 1 drop into both eyes every 12 (twelve) hours.        No current facility-administered medications on file prior to visit.    Allergies  Allergen Reactions  . Erythromycin     REACTION: Rash  . Meperidine Hcl     REACTION: Nausea    Family History  Problem Relation Age of Onset  . Colon cancer Mother     BP 124/60 mmHg  Pulse 76  Temp(Src) 98.2 F (36.8 C) (Oral)  Wt 170 lb (77.111 kg)  SpO2 94%   Review of Systems  Constitutional: Negative for fever.  HENT: Negative for hearing loss.   Eyes: Negative for visual disturbance.  Respiratory: Negative for shortness of breath.   Cardiovascular: Negative for chest pain.  Gastrointestinal: Negative for anal bleeding.  Endocrine: Negative for cold intolerance.  Musculoskeletal: Negative for gait problem.  Skin: Negative for rash.  Allergic/Immunologic: Negative for environmental allergies.  Neurological: Negative for syncope.  Hematological: Does not bruise/bleed easily.  Psychiatric/Behavioral: Negative for dysphoric mood.       Objective:   Physical Exam VS: see vs page GEN: no distress HEAD: head: no deformity eyes: no periorbital swelling, no proptosis external nose and ears are normal mouth: no lesion seen  NECK: supple, thyroid is not enlarged CHEST WALL: no deformity LUNGS:  Clear to auscultation BREASTS: sees gyn CV: reg rate and rhythm, no murmur GENITALIA/RECTAL: sees gyn MUSCULOSKELETAL: muscle bulk and strength are grossly normal.  no obvious joint swelling.  gait is normal and steady EXTEMITIES: no deformity.  no ulcer on the feet.  feet are of normal color and temp.  no edema PULSES: dorsalis pedis intact bilat.  no carotid bruit NEURO:  cn 2-12 grossly intact.   readily moves all 4's.  sensation is intact to touch on the feet SKIN:  Normal texture and temperature.  No rash or suspicious lesion is visible.   NODES:  None palpable at the neck PSYCH: alert, well-oriented.  Does not appear anxious nor depressed.       Assessment & Plan:  Wellness  visit today, with problems stable, except as noted.   Patient is advised the following: Patient Instructions  please consider these measures for your health:  minimize alcohol.  do not use tobacco products.  have a colonoscopy at least every 10 years from age 47.  Women should have an annual mammogram from age 52.  keep firearms safely stored.  always use seat belts.  have working smoke alarms in your home.  see an eye doctor and dentist regularly.  never drive under the influence of alcohol or drugs (including prescription drugs).  those with fair skin should take precautions against the sun. it is critically important to prevent falling down (keep floor areas well-lit, dry, and free of loose objects.  If you have a cane, walker, or wheelchair, you should use it, even for short trips around the house.  Also, try not to rush). Please have the bone density checked.  you will receive a phone call, about a day and time for an appointment. Please come back for a follow-up appointment in 6 months. blood tests are being requested for you today.  We'll let you know about the results. We'll reduce the cholesterol pill if possible.     SEPARATE EVALUATION FOLLOWS--EACH PROBLEM HERE IS NEW, NOT RESPONDING TO TREATMENT, OR POSES SIGNIFICANT RISK TO THE PATIENT'S HEALTH: HISTORY OF THE PRESENT ILLNESS: Pt says she takes klor as rx'ed.  She denies hematuria PAST MEDICAL HISTORY reviewed and up to date today REVIEW OF SYSTEMS: Denies numbness PHYSICAL EXAMINATION: VITAL SIGNS:  See vs page GENERAL: no distress Ext: no edema ABD: abdomen is soft, nontender.  no hepatosplenomegaly.  not distended.  no hernia LAB/XRAY RESULTS: Lab Results  Component Value Date   CREATININE 0.77 09/05/2014   BUN 18 09/05/2014   NA 136 09/05/2014   K 3.3* 09/05/2014   CL 102 09/05/2014   CO2 29 09/05/2014   Lab Results  Component Value Date   WBC 5.7 09/05/2014   HGB 11.1* 09/05/2014   HCT 32.3* 09/05/2014   MCV  94.3 09/05/2014   PLT 273.0 09/05/2014  IMPRESSION: Anemia: slightly worse Hypokalemia: persistent PLAN:  increase the potassium to 3 pills per day. You can take them all at once, or you can spread them out. We'll continue to check the anemia in the future.

## 2014-09-05 NOTE — Patient Instructions (Addendum)
please consider these measures for your health:  minimize alcohol.  do not use tobacco products.  have a colonoscopy at least every 10 years from age 70.  Women should have an annual mammogram from age 46.  keep firearms safely stored.  always use seat belts.  have working smoke alarms in your home.  see an eye doctor and dentist regularly.  never drive under the influence of alcohol or drugs (including prescription drugs).  those with fair skin should take precautions against the sun. it is critically important to prevent falling down (keep floor areas well-lit, dry, and free of loose objects.  If you have a cane, walker, or wheelchair, you should use it, even for short trips around the house.  Also, try not to rush). Please have the bone density checked.  you will receive a phone call, about a day and time for an appointment. Please come back for a follow-up appointment in 6 months. blood tests are being requested for you today.  We'll let you know about the results. We'll reduce the cholesterol pill if possible.

## 2014-09-05 NOTE — Progress Notes (Signed)
husb is [facility].  we discussed code status.  pt requests full code, but would not want to be started or maintained on artificial life-support measures if there was not a reasonable chance of recovery.

## 2014-09-06 MED ORDER — POTASSIUM CHLORIDE ER 10 MEQ PO TBCR: 10.0000 meq | EXTENDED_RELEASE_TABLET | Freq: Three times a day (TID) | ORAL | Status: DC

## 2014-09-12 ENCOUNTER — Ambulatory Visit (INDEPENDENT_AMBULATORY_CARE_PROVIDER_SITE_OTHER): Payer: Medicare Other

## 2014-09-12 DIAGNOSIS — Z780 Asymptomatic menopausal state: Secondary | ICD-10-CM | POA: Diagnosis not present

## 2014-09-12 DIAGNOSIS — M898X8 Other specified disorders of bone, other site: Secondary | ICD-10-CM | POA: Diagnosis not present

## 2014-10-15 ENCOUNTER — Encounter: Payer: Self-pay | Admitting: Endocrinology

## 2014-11-28 ENCOUNTER — Other Ambulatory Visit: Payer: Self-pay | Admitting: Endocrinology

## 2014-12-17 ENCOUNTER — Other Ambulatory Visit: Payer: Self-pay

## 2014-12-31 ENCOUNTER — Telehealth: Payer: Self-pay | Admitting: Endocrinology

## 2014-12-31 MED ORDER — PIOGLITAZONE HCL 15 MG PO TABS: ORAL_TABLET | ORAL | Status: DC

## 2014-12-31 NOTE — Telephone Encounter (Signed)
Patient called stating [facility] will be calling our office regarding her Rx   Rx: Pioglitazone    Thank you

## 2014-12-31 NOTE — Telephone Encounter (Signed)
New rx for pioglitazone to [facility].

## 2015-02-14 ENCOUNTER — Other Ambulatory Visit: Payer: Self-pay | Admitting: Endocrinology

## 2015-02-14 ENCOUNTER — Other Ambulatory Visit: Payer: Self-pay | Admitting: Internal Medicine

## 2015-03-08 ENCOUNTER — Ambulatory Visit (INDEPENDENT_AMBULATORY_CARE_PROVIDER_SITE_OTHER): Payer: Medicare Other | Admitting: Endocrinology

## 2015-03-08 ENCOUNTER — Encounter: Payer: Self-pay | Admitting: Endocrinology

## 2015-03-08 VITALS — BP 130/88 | HR 73 | Temp 98.4°F | Wt 171.0 lb

## 2015-03-08 DIAGNOSIS — R739 Hyperglycemia, unspecified: Secondary | ICD-10-CM

## 2015-03-08 LAB — POCT GLYCOSYLATED HEMOGLOBIN (HGB A1C): HEMOGLOBIN A1C: 5.6

## 2015-03-08 NOTE — Progress Notes (Signed)
Subjective:    Patient ID: [name], female    DOB: 06/07/1946, 69 y.o.   MRN: [id number]  HPI Pt returns for f/u of diabetes mellitus: DM type: 2 Dx'ed: 6270 Complications: none Therapy: pioglitizone GDM: never DKA: never Severe hypoglycemia: never Pancreatitis: never Other: she has never been on insulin Interval history: pt states she feels well in general.   Past Medical History  Diagnosis Date  . Blood transfusion     for GI bleed 1987  . Arthritis   . Diabetes mellitus   . Thyroid disease   . Hypertension     Past Surgical History  Procedure Laterality Date  . Thyroidectomy, partial    . Rt knee arthroscopy      Social History   Social History  . Marital Status: Married    Spouse Name: N/A  . Number of Children: N/A  . Years of Education: N/A   Occupational History  . Not on file.   Social History Main Topics  . Smoking status: Former Smoker    Quit date: 09/09/1986  . Smokeless tobacco: Not on file  . Alcohol Use: 7.0 oz/week    14 drink(s) per week  . Drug Use: No  . Sexual Activity: Not on file   Other Topics Concern  . Not on file   Social History Narrative    Current Outpatient Prescriptions on File Prior to Visit  Medication Sig Dispense Refill  . amLODipine (NORVASC) 2.5 MG tablet Take 1 tablet by mouth  daily 90 tablet 0  . carvedilol (COREG) 6.25 MG tablet Take 1 tablet by mouth  twice a day with meals 180 tablet 0  . ibuprofen (ADVIL,MOTRIN) 200 MG tablet 200 mg daily. 3 tablets every morning    . levothyroxine (SYNTHROID, LEVOTHROID) 50 MCG tablet Take 1 tablet by mouth  daily before breakfast 90 tablet 0  . losartan-hydrochlorothiazide (HYZAAR) 100-25 MG per tablet Take 1 tablet by mouth  daily 90 tablet 0  . lovastatin (MEVACOR) 40 MG tablet Take 1 tablet by mouth two  times daily 180 tablet 0  . pioglitazone (ACTOS) 15 MG tablet Take 1 tablet by mouth  daily 90 tablet 1  . potassium chloride (K-DUR) 10 MEQ tablet Take 1  tablet (10 mEq total) by mouth 3 (three) times daily. 270 tablet 3  . raloxifene (EVISTA) 60 MG tablet Take 1 tablet by mouth  daily 90 tablet 0  . RESTASIS 0.05 % ophthalmic emulsion Place 1 drop into both eyes every 12 (twelve) hours.      No current facility-administered medications on file prior to visit.    Allergies  Allergen Reactions  . Erythromycin     REACTION: Rash  . Meperidine Hcl     REACTION: Nausea    Family History  Problem Relation Age of Onset  . Colon cancer Mother     BP 130/88 mmHg  Pulse 73  Temp(Src) 98.4 F (36.9 C) (Oral)  Wt 171 lb (77.565 kg)  SpO2 96%   Review of Systems Denies chest pain and sob    Objective:   Physical Exam VITAL SIGNS:  See vs page GENERAL: no distress Pulses: dorsalis pedis intact bilat.   MSK: no deformity of the feet CV: no leg edema Skin:  no ulcer on the feet.  normal color and temp on the feet. Neuro: sensation is intact to touch on the feet.    A1c=5.6% DM: well-controlled Please continue the same medication  Subjective:   Patient  here for Medicare annual wellness visit and management of other chronic and acute problems.     Risk factors: advanced age    46 of Physicians Providing Medical Care to Patient:  See "snapshot"   Activities of Daily Living: In your present state of health, do you have any difficulty performing the following activities?:  Preparing food and eating?: No  Bathing yourself: No  Getting dressed: No  Using the toilet:No  Moving around from place to place: No  In the past year have you fallen or had a near fall?: No    Home Safety: Has smoke detector and wears seat belts. Firearms are safely stored. No excess sun exposure.   Diet and Exercise  Current exercise habits: pt says not good Dietary issues discussed: pt reports a healthy diet   Depression Screen  Q1: Over the past two weeks, have you felt down, depressed or hopeless? no  Q2: Over the past two weeks, have you  felt little interest or pleasure in doing things? no   The following portions of the patient's history were reviewed and updated as appropriate: allergies, current medications, past family history, past medical history, past social history, past surgical history and problem list.   Review of Systems  Denies hearing loss, and visual loss Objective:   Vision:  Sees opthalmologist Hearing: grossly normal Body mass index:  See vs page Msk: pt easily and quickly performs "get-up-and-go" from a sitting position Cognitive Impairment Assessment: cognition, memory and judgment appear normal.  remembers 3/3 at 5 minutes.  excellent recall.  can easily read and write a sentence.  alert and oriented x 3.     Assessment:   Medicare wellness utd on preventive parameters    Plan:   During the course of the visit the patient was educated and counseled about appropriate screening.  and preventive services including:        Fall prevention   Screening mammography  Bone densitometry screening  Diabetes screening  Nutrition counseling   Vaccines / LABS Zostavax / Pneumococcal Vaccine  today   Patient Instructions (the written plan) was given to the patient.        Assessment & Plan:  Pt declines other labs today.

## 2015-03-08 NOTE — Patient Instructions (Addendum)
please consider these measures for your health:  minimize alcohol.  do not use tobacco products.  have a colonoscopy at least every 10 years from age 70.  Women should have an annual mammogram from age 55.  keep firearms safely stored.  always use seat belts.  have working smoke alarms in your home.  see an eye doctor and dentist regularly.  never drive under the influence of alcohol or drugs (including prescription drugs).  those with fair skin should take precautions against the sun.  it is critically important to prevent falling down (keep floor areas well-lit, dry, and free of loose objects.  If you have a cane, walker, or wheelchair, you should use it, even for short trips around the house.  Also, try not to rush).   good diet and exercise significantly improve your health.  please let me know if you wish to be referred to a dietician.  high blood sugar is very risky to your health.  you should see an eye doctor and dentist every year.  It is very important to get all recommended vaccinations.   Please continue the same medications.  Please come back for a regular physical appointment in 6 months (must be after 09/05/15).

## 2015-03-08 NOTE — Progress Notes (Signed)
we discussed code status.  pt requests full code, but would not want to be started or maintained on artificial life-support measures if there was not a reasonable chance of recovery 

## 2015-03-28 DIAGNOSIS — E119 Type 2 diabetes mellitus without complications: Secondary | ICD-10-CM | POA: Diagnosis not present

## 2015-03-28 DIAGNOSIS — Z961 Presence of intraocular lens: Secondary | ICD-10-CM | POA: Diagnosis not present

## 2015-03-28 DIAGNOSIS — H04123 Dry eye syndrome of bilateral lacrimal glands: Secondary | ICD-10-CM | POA: Diagnosis not present

## 2015-03-28 LAB — HM DIABETES EYE EXAM

## 2015-03-29 ENCOUNTER — Encounter: Payer: Self-pay | Admitting: Endocrinology

## 2015-04-03 ENCOUNTER — Other Ambulatory Visit: Payer: Self-pay | Admitting: Endocrinology

## 2015-04-03 DIAGNOSIS — Z1231 Encounter for screening mammogram for malignant neoplasm of breast: Secondary | ICD-10-CM

## 2015-04-08 ENCOUNTER — Ambulatory Visit: Payer: Self-pay

## 2015-04-19 ENCOUNTER — Encounter: Payer: Self-pay | Admitting: Endocrinology

## 2015-04-23 ENCOUNTER — Other Ambulatory Visit: Payer: Self-pay | Admitting: Endocrinology

## 2015-05-29 ENCOUNTER — Ambulatory Visit: Payer: Self-pay

## 2015-06-21 ENCOUNTER — Other Ambulatory Visit: Payer: Self-pay | Admitting: Endocrinology

## 2015-07-10 ENCOUNTER — Other Ambulatory Visit: Payer: Self-pay | Admitting: Internal Medicine

## 2015-07-31 ENCOUNTER — Ambulatory Visit (INDEPENDENT_AMBULATORY_CARE_PROVIDER_SITE_OTHER): Payer: Medicare Other

## 2015-07-31 DIAGNOSIS — Z1231 Encounter for screening mammogram for malignant neoplasm of breast: Secondary | ICD-10-CM | POA: Diagnosis not present

## 2015-08-09 ENCOUNTER — Other Ambulatory Visit: Payer: Self-pay | Admitting: Endocrinology

## 2015-08-16 ENCOUNTER — Other Ambulatory Visit: Payer: Self-pay | Admitting: Endocrinology

## 2015-08-16 ENCOUNTER — Other Ambulatory Visit: Payer: Self-pay | Admitting: Internal Medicine

## 2015-09-06 ENCOUNTER — Ambulatory Visit: Payer: Self-pay | Admitting: Endocrinology

## 2015-09-13 ENCOUNTER — Other Ambulatory Visit: Payer: Self-pay | Admitting: Endocrinology

## 2015-10-04 ENCOUNTER — Other Ambulatory Visit: Payer: Self-pay | Admitting: Endocrinology

## 2015-10-16 ENCOUNTER — Encounter: Payer: Self-pay | Admitting: Endocrinology

## 2015-10-16 ENCOUNTER — Ambulatory Visit (INDEPENDENT_AMBULATORY_CARE_PROVIDER_SITE_OTHER): Payer: Medicare Other | Admitting: Endocrinology

## 2015-10-16 VITALS — BP 132/70 | HR 79 | Temp 98.2°F | Ht 63.0 in | Wt 168.0 lb

## 2015-10-16 DIAGNOSIS — Z0000 Encounter for general adult medical examination without abnormal findings: Secondary | ICD-10-CM

## 2015-10-16 DIAGNOSIS — R9431 Abnormal electrocardiogram [ECG] [EKG]: Secondary | ICD-10-CM | POA: Diagnosis not present

## 2015-10-16 DIAGNOSIS — Z0001 Encounter for general adult medical examination with abnormal findings: Secondary | ICD-10-CM

## 2015-10-16 DIAGNOSIS — R739 Hyperglycemia, unspecified: Secondary | ICD-10-CM

## 2015-10-16 DIAGNOSIS — E876 Hypokalemia: Secondary | ICD-10-CM | POA: Diagnosis not present

## 2015-10-16 DIAGNOSIS — E890 Postprocedural hypothyroidism: Secondary | ICD-10-CM

## 2015-10-16 DIAGNOSIS — D649 Anemia, unspecified: Secondary | ICD-10-CM | POA: Diagnosis not present

## 2015-10-16 LAB — HEPATIC FUNCTION PANEL
ALT: 11 U/L (ref 0–35)
AST: 17 U/L (ref 0–37)
Albumin: 4 g/dL (ref 3.5–5.2)
Alkaline Phosphatase: 37 U/L — ABNORMAL LOW (ref 39–117)
Bilirubin, Direct: 0.1 mg/dL (ref 0.0–0.3)
TOTAL PROTEIN: 6.7 g/dL (ref 6.0–8.3)
Total Bilirubin: 0.4 mg/dL (ref 0.2–1.2)

## 2015-10-16 LAB — BASIC METABOLIC PANEL
BUN: 16 mg/dL (ref 6–23)
CO2: 28 mEq/L (ref 19–32)
Calcium: 9 mg/dL (ref 8.4–10.5)
Chloride: 101 mEq/L (ref 96–112)
Creatinine, Ser: 0.84 mg/dL (ref 0.40–1.20)
GFR: 71.04 mL/min (ref >60.00)
Glucose, Bld: 97 mg/dL (ref 70–99)
POTASSIUM: 3.7 meq/L (ref 3.5–5.1)
SODIUM: 137 meq/L (ref 135–145)

## 2015-10-16 LAB — CBC WITH DIFFERENTIAL/PLATELET
BASOS ABS: 0 10*3/uL (ref 0.0–0.1)
Basophils Relative: 0.5 % (ref 0.0–3.0)
EOS ABS: 0.1 10*3/uL (ref 0.0–0.7)
Eosinophils Relative: 1 % (ref 0.0–5.0)
HCT: 32.5 % — ABNORMAL LOW (ref 36.0–46.0)
Hemoglobin: 11 g/dL — ABNORMAL LOW (ref 12.0–15.0)
LYMPHS ABS: 1.4 10*3/uL (ref 0.7–4.0)
Lymphocytes Relative: 26 % (ref 12.0–46.0)
MCHC: 33.8 g/dL (ref 30.0–36.0)
MCV: 93.6 fl (ref 78.0–100.0)
MONO ABS: 0.4 10*3/uL (ref 0.1–1.0)
Monocytes Relative: 7.5 % (ref 3.0–12.0)
NEUTROS ABS: 3.5 10*3/uL (ref 1.4–7.7)
NEUTROS PCT: 65 % (ref 43.0–77.0)
PLATELETS: 287 10*3/uL (ref 150.0–400.0)
RBC: 3.48 Mil/uL — AB (ref 3.87–5.11)
RDW: 15 % (ref 11.5–15.5)
WBC: 5.3 10*3/uL (ref 4.0–10.5)

## 2015-10-16 LAB — TSH: TSH: 1.42 u[IU]/mL (ref 0.35–4.50)

## 2015-10-16 LAB — IBC PANEL
IRON: 77 ug/dL (ref 42–145)
SATURATION RATIOS: 20.8 % (ref 20.0–50.0)
TRANSFERRIN: 264 mg/dL (ref 212.0–360.0)

## 2015-10-16 LAB — URINALYSIS, ROUTINE W REFLEX MICROSCOPIC
Bilirubin Urine: NEGATIVE
Hgb urine dipstick: NEGATIVE
LEUKOCYTES UA: NEGATIVE
Nitrite: NEGATIVE
PH: 5.5 (ref 5.0–8.0)
RBC / HPF: NONE SEEN (ref 0–?)
SPECIFIC GRAVITY, URINE: 1.015 (ref 1.000–1.030)
URINE GLUCOSE: NEGATIVE
UROBILINOGEN UA: 0.2 (ref 0.0–1.0)

## 2015-10-16 LAB — LIPID PANEL
CHOL/HDL RATIO: 2
Cholesterol: 194 mg/dL (ref 0–200)
HDL: 82.1 mg/dL (ref >39.00)
LDL CALC: 89 mg/dL (ref 0–99)
NONHDL: 111.61
TRIGLYCERIDES: 111 mg/dL (ref 0.0–149.0)
VLDL: 22.2 mg/dL (ref 0.0–40.0)

## 2015-10-16 LAB — HEMOGLOBIN A1C: Hgb A1c MFr Bld: 5.9 % (ref 4.6–6.5)

## 2015-10-16 NOTE — Progress Notes (Signed)
Subjective:    Patient ID: [name], female    DOB: [birth year], 70 y.o.   MRN: [id number]  HPI  Pt is here for regular wellness examination, and is feeling pretty well in general, and says chronic med probs are stable, except as noted below. Past Medical History  Diagnosis Date  . Blood transfusion     for GI bleed 1987  . Arthritis   . Diabetes mellitus   . Thyroid disease   . Hypertension     Past Surgical History  Procedure Laterality Date  . Thyroidectomy, partial    . Rt knee arthroscopy      Social History   Social History  . Marital Status: Married    Spouse Name: N/A  . Number of Children: N/A  . Years of Education: N/A   Occupational History  . Not on file.   Social History Main Topics  . Smoking status: Former Smoker    Quit date: 09/09/1986  . Smokeless tobacco: Not on file  . Alcohol Use: 7.0 oz/week    14 drink(s) per week  . Drug Use: No  . Sexual Activity: Not on file   Other Topics Concern  . Not on file   Social History Narrative    Current Outpatient Prescriptions on File Prior to Visit  Medication Sig Dispense Refill  . amLODipine (NORVASC) 2.5 MG tablet Take 1 tablet by mouth  daily 90 tablet 0  . carvedilol (COREG) 6.25 MG tablet Take 1 tablet by mouth  twice a day with meals 180 tablet 0  . cholecalciferol (VITAMIN D) 1000 UNITS tablet Take 1,000 Units by mouth daily.    . ferrous sulfate 325 (65 FE) MG tablet Take 325 mg by mouth daily with breakfast.    . ibuprofen (ADVIL,MOTRIN) 200 MG tablet 200 mg daily. 3 tablets every morning    . levothyroxine (SYNTHROID, LEVOTHROID) 50 MCG tablet Take 1 tablet by mouth  daily before breakfast 90 tablet 0  . losartan-hydrochlorothiazide (HYZAAR) 100-25 MG tablet Take 1 tablet by mouth daily. Appointment needed for further refills. 90 tablet 0  . lovastatin (MEVACOR) 40 MG tablet Take 1 tablet by mouth two  times daily 180 tablet 2  . pioglitazone (ACTOS) 15 MG tablet Take 1 tablet by  mouth  daily 90 tablet 2  . raloxifene (EVISTA) 60 MG tablet Take 1 tablet by mouth  daily 90 tablet 0  . RESTASIS 0.05 % ophthalmic emulsion Place 1 drop into both eyes every 12 (twelve) hours.      No current facility-administered medications on file prior to visit.    Allergies  Allergen Reactions  . Erythromycin     REACTION: Rash  . Meperidine Hcl     REACTION: Nausea    Family History  Problem Relation Age of Onset  . Colon cancer Mother     BP 132/70 mmHg  Pulse 79  Temp(Src) 98.2 F (36.8 C) (Oral)  Ht 5\' 3"  (1.6 m)  Wt 168 lb (76.204 kg)  BMI 29.77 kg/m2  SpO2 95%   Review of Systems  Constitutional: Negative for fever.  HENT: Negative for hearing loss.   Eyes: Negative for visual disturbance.  Respiratory: Negative for shortness of breath.   Cardiovascular: Negative for chest pain.  Gastrointestinal: Negative for blood in stool.  Endocrine: Negative for cold intolerance.  Genitourinary: Negative for hematuria.  Musculoskeletal:       Chronic low-back pain is improved recently  Skin: Negative for rash.  Allergic/Immunologic:  Negative for environmental allergies.  Hematological: Does not bruise/bleed easily.  Psychiatric/Behavioral: Negative for dysphoric mood.       Objective:   Physical Exam VS: see vs page GEN: no distress HEAD: head: no deformity eyes: no periorbital swelling, no proptosis external nose and ears are normal mouth: no lesion seen NECK: a healed scar is present.  i do not appreciate a nodule in the thyroid or elsewhere in the neck CHEST WALL: no deformity LUNGS:  Clear to auscultation BREASTS: sees gyn CV: reg rate and rhythm, no murmur ABD: abdomen is soft, nontender.  no hepatosplenomegaly.  not distended.  no hernia GENITALIA/RECTAL: sees gyn MUSCULOSKELETAL: muscle bulk and strength are grossly normal.  no obvious joint swelling.  gait is normal and steady EXTEMITIES: no deformity.  no ulcer on the feet.  feet are of normal  color and temp.   PULSES: dorsalis pedis intact bilat.  no carotid bruit NEURO:  cn 2-12 grossly intact.   readily moves all 4's.  sensation is intact to touch on the feet SKIN:  Normal texture and temperature.  No rash or suspicious lesion is visible.   NODES:  None palpable at the neck PSYCH: alert, well-oriented.  Does not appear anxious nor depressed.   i personally reviewed electrocardiogram tracing (today): Indication: wellness Impression: 62 old IMI    Assessment & Plan:  Wellness visit today, with problems stable, except as noted.     Patient is advised the following: Patient Instructions  good diet and exercise significantly improve the control of your diabetes.  please let me know if you wish to be referred to a dietician.  high blood sugar is very risky to your health.  you should see an eye doctor and dentist every year.  It is very important to get all recommended vaccinations.  please consider these measures for your health:  minimize alcohol.  do not use tobacco products.  have a colonoscopy at least every 10 years from age 72.  Women should have an annual mammogram from age 72.  keep firearms safely stored.  always use seat belts.  have working smoke alarms in your home.  see an eye doctor and dentist regularly.  never drive under the influence of alcohol or drugs (including prescription drugs).  those with fair skin should take precautions against the sun. it is critically important to prevent falling down (keep floor areas well-lit, dry, and free of loose objects.  If you have a cane, walker, or wheelchair, you should use it, even for short trips around the house.  Wear flat-soled shoes.  Also, try not to rush) Based on there results, I'll change the potassium to a different type.   Please come back for a follow-up appointment in 6 months.     SEPARATE EVALUATION FOLLOWS--EACH PROBLEM HERE IS NEW, NOT RESPONDING TO TREATMENT, OR POSES SIGNIFICANT RISK TO THE PATIENT'S  HEALTH: HISTORY OF THE PRESENT ILLNESS: She says KLOR causes diarrhea PAST MEDICAL HISTORY reviewed and up to date today REVIEW OF SYSTEMS: Denies numbness PHYSICAL EXAMINATION: VITAL SIGNS:  See vs page GENERAL: no distress Ext: no edema LAB/XRAY RESULTS: Lab Results  Component Value Date   CREATININE 0.84 10/16/2015   BUN 16 10/16/2015   NA 137 10/16/2015   K 3.7 10/16/2015   CL 101 10/16/2015   CO2 28 10/16/2015  IMPRESSION: Hypokalemia: well-controlled Diarrhea, ? due to [place]:  i have sent a prescription to your pharmacy, to change the type of K+ supplement

## 2015-10-16 NOTE — Progress Notes (Signed)
we discussed code status.  pt requests full code, but would not want to be started or maintained on artificial life-support measures if there was not a reasonable chance of recovery 

## 2015-10-16 NOTE — Patient Instructions (Addendum)
good diet and exercise significantly improve the control of your diabetes.  please let me know if you wish to be referred to a dietician.  high blood sugar is very risky to your health.  you should see an eye doctor and dentist every year.  It is very important to get all recommended vaccinations.  please consider these measures for your health:  minimize alcohol.  do not use tobacco products.  have a colonoscopy at least every 10 years from age 71.  Women should have an annual mammogram from age 59.  keep firearms safely stored.  always use seat belts.  have working smoke alarms in your home.  see an eye doctor and dentist regularly.  never drive under the influence of alcohol or drugs (including prescription drugs).  those with fair skin should take precautions against the sun. it is critically important to prevent falling down (keep floor areas well-lit, dry, and free of loose objects.  If you have a cane, walker, or wheelchair, you should use it, even for short trips around the house.  Wear flat-soled shoes.  Also, try not to rush) Based on there results, I'll change the potassium to a different type.   Please come back for a follow-up appointment in 6 months.

## 2015-10-17 ENCOUNTER — Telehealth: Payer: Self-pay | Admitting: Endocrinology

## 2015-10-17 LAB — HEPATITIS C ANTIBODY: HCV Ab: NEGATIVE

## 2015-10-17 MED ORDER — POTASSIUM CHLORIDE ER 8 MEQ PO CPCR: 16.0000 meq | ORAL_CAPSULE | Freq: Two times a day (BID) | ORAL | Status: DC

## 2015-10-17 NOTE — Telephone Encounter (Signed)
please call patient: i have sent a prescription to your pharmacy, for the different type of potassium pill, to see if this change helps your diarrhea

## 2015-10-18 NOTE — Telephone Encounter (Signed)
I contacted the pt and left a vm advising of note below. Requested a call back from the pt if she would like to discuss.

## 2015-11-01 ENCOUNTER — Other Ambulatory Visit: Payer: Self-pay | Admitting: Endocrinology

## 2015-11-24 ENCOUNTER — Other Ambulatory Visit: Payer: Self-pay | Admitting: Endocrinology

## 2015-12-02 ENCOUNTER — Telehealth: Payer: Self-pay | Admitting: Endocrinology

## 2015-12-02 MED ORDER — POTASSIUM CHLORIDE ER 8 MEQ PO CPCR: 16.0000 meq | ORAL_CAPSULE | Freq: Two times a day (BID) | ORAL | Status: DC

## 2015-12-02 NOTE — Telephone Encounter (Signed)
[facility] needs clarification on the Potassium Chloride prescription, CB# [phone], option 1, Ref # [id number]

## 2015-12-02 NOTE — Telephone Encounter (Signed)
Rx clarified and re-sent.

## 2015-12-02 NOTE — Telephone Encounter (Signed)
Pt's pharmacy needs calfification on the potassium rx. Is the pt taking 10 meq or 8 meq? Thanks!

## 2015-12-02 NOTE — Telephone Encounter (Signed)
8 mEq

## 2016-01-06 DIAGNOSIS — Z881 Allergy status to other antibiotic agents status: Secondary | ICD-10-CM | POA: Diagnosis not present

## 2016-01-06 DIAGNOSIS — Z885 Allergy status to narcotic agent status: Secondary | ICD-10-CM | POA: Diagnosis not present

## 2016-01-06 DIAGNOSIS — I10 Essential (primary) hypertension: Secondary | ICD-10-CM | POA: Diagnosis not present

## 2016-01-06 DIAGNOSIS — Z79899 Other long term (current) drug therapy: Secondary | ICD-10-CM | POA: Diagnosis not present

## 2016-01-06 DIAGNOSIS — Z9889 Other specified postprocedural states: Secondary | ICD-10-CM | POA: Diagnosis not present

## 2016-01-06 DIAGNOSIS — E039 Hypothyroidism, unspecified: Secondary | ICD-10-CM | POA: Diagnosis not present

## 2016-01-06 DIAGNOSIS — M1711 Unilateral primary osteoarthritis, right knee: Secondary | ICD-10-CM | POA: Diagnosis not present

## 2016-01-06 DIAGNOSIS — M25561 Pain in right knee: Secondary | ICD-10-CM | POA: Diagnosis not present

## 2016-01-06 DIAGNOSIS — G8929 Other chronic pain: Secondary | ICD-10-CM | POA: Diagnosis not present

## 2016-01-15 ENCOUNTER — Other Ambulatory Visit: Payer: Self-pay | Admitting: Endocrinology

## 2016-01-27 ENCOUNTER — Other Ambulatory Visit: Payer: Self-pay | Admitting: Endocrinology

## 2016-02-06 ENCOUNTER — Other Ambulatory Visit: Payer: Self-pay | Admitting: Endocrinology

## 2016-04-02 ENCOUNTER — Other Ambulatory Visit: Payer: Self-pay | Admitting: Endocrinology

## 2016-04-07 DIAGNOSIS — E119 Type 2 diabetes mellitus without complications: Secondary | ICD-10-CM | POA: Diagnosis not present

## 2016-04-07 DIAGNOSIS — E039 Hypothyroidism, unspecified: Secondary | ICD-10-CM | POA: Diagnosis not present

## 2016-04-07 DIAGNOSIS — Z888 Allergy status to other drugs, medicaments and biological substances status: Secondary | ICD-10-CM | POA: Diagnosis not present

## 2016-04-07 DIAGNOSIS — Z79899 Other long term (current) drug therapy: Secondary | ICD-10-CM | POA: Diagnosis not present

## 2016-04-07 DIAGNOSIS — M25561 Pain in right knee: Secondary | ICD-10-CM | POA: Diagnosis not present

## 2016-04-07 DIAGNOSIS — G8929 Other chronic pain: Secondary | ICD-10-CM | POA: Diagnosis not present

## 2016-04-07 DIAGNOSIS — I10 Essential (primary) hypertension: Secondary | ICD-10-CM | POA: Diagnosis not present

## 2016-04-07 DIAGNOSIS — M1711 Unilateral primary osteoarthritis, right knee: Secondary | ICD-10-CM | POA: Diagnosis not present

## 2016-04-16 DIAGNOSIS — Z961 Presence of intraocular lens: Secondary | ICD-10-CM | POA: Diagnosis not present

## 2016-04-16 DIAGNOSIS — H04123 Dry eye syndrome of bilateral lacrimal glands: Secondary | ICD-10-CM | POA: Diagnosis not present

## 2016-04-16 DIAGNOSIS — H524 Presbyopia: Secondary | ICD-10-CM | POA: Diagnosis not present

## 2016-04-16 DIAGNOSIS — H52203 Unspecified astigmatism, bilateral: Secondary | ICD-10-CM | POA: Diagnosis not present

## 2016-04-16 DIAGNOSIS — E119 Type 2 diabetes mellitus without complications: Secondary | ICD-10-CM | POA: Diagnosis not present

## 2016-05-06 DIAGNOSIS — M1711 Unilateral primary osteoarthritis, right knee: Secondary | ICD-10-CM | POA: Diagnosis not present

## 2016-05-06 DIAGNOSIS — Z7952 Long term (current) use of systemic steroids: Secondary | ICD-10-CM | POA: Diagnosis not present

## 2016-08-07 DIAGNOSIS — M1711 Unilateral primary osteoarthritis, right knee: Secondary | ICD-10-CM | POA: Diagnosis not present

## 2016-08-25 DIAGNOSIS — Z885 Allergy status to narcotic agent status: Secondary | ICD-10-CM | POA: Diagnosis not present

## 2016-08-25 DIAGNOSIS — E039 Hypothyroidism, unspecified: Secondary | ICD-10-CM | POA: Diagnosis not present

## 2016-08-25 DIAGNOSIS — Z881 Allergy status to other antibiotic agents status: Secondary | ICD-10-CM | POA: Diagnosis not present

## 2016-08-25 DIAGNOSIS — Z9189 Other specified personal risk factors, not elsewhere classified: Secondary | ICD-10-CM | POA: Diagnosis not present

## 2016-08-25 DIAGNOSIS — E785 Hyperlipidemia, unspecified: Secondary | ICD-10-CM | POA: Diagnosis not present

## 2016-08-25 DIAGNOSIS — M1711 Unilateral primary osteoarthritis, right knee: Secondary | ICD-10-CM | POA: Diagnosis not present

## 2016-08-25 DIAGNOSIS — E119 Type 2 diabetes mellitus without complications: Secondary | ICD-10-CM | POA: Diagnosis not present

## 2016-08-25 DIAGNOSIS — D649 Anemia, unspecified: Secondary | ICD-10-CM | POA: Diagnosis not present

## 2016-08-25 DIAGNOSIS — Z87891 Personal history of nicotine dependence: Secondary | ICD-10-CM | POA: Diagnosis not present

## 2016-08-25 DIAGNOSIS — I10 Essential (primary) hypertension: Secondary | ICD-10-CM | POA: Diagnosis not present

## 2016-08-25 DIAGNOSIS — Z791 Long term (current) use of non-steroidal anti-inflammatories (NSAID): Secondary | ICD-10-CM | POA: Diagnosis not present

## 2016-08-25 DIAGNOSIS — Z79899 Other long term (current) drug therapy: Secondary | ICD-10-CM | POA: Diagnosis not present

## 2016-08-31 DIAGNOSIS — M1711 Unilateral primary osteoarthritis, right knee: Secondary | ICD-10-CM | POA: Diagnosis not present

## 2016-08-31 DIAGNOSIS — Z01818 Encounter for other preprocedural examination: Secondary | ICD-10-CM | POA: Diagnosis not present

## 2016-09-03 ENCOUNTER — Ambulatory Visit (INDEPENDENT_AMBULATORY_CARE_PROVIDER_SITE_OTHER): Payer: Medicare Other | Admitting: Physical Therapy

## 2016-09-03 DIAGNOSIS — M25661 Stiffness of right knee, not elsewhere classified: Secondary | ICD-10-CM | POA: Diagnosis not present

## 2016-09-03 DIAGNOSIS — M25561 Pain in right knee: Secondary | ICD-10-CM | POA: Diagnosis not present

## 2016-09-03 DIAGNOSIS — R2689 Other abnormalities of gait and mobility: Secondary | ICD-10-CM

## 2016-09-03 NOTE — Therapy (Signed)
[place], [place], [address] Phone: [phone]   Fax:  [phone]  Physical Therapy Evaluation  Patient Details  Name: [name] MRN: [id number] Date of Birth: Oct 24, 1945 Referring Provider: Dr. Corky Mull  Encounter Date: 09/03/2016      PT End of Session - 09/03/16 1524    Visit Number 1   Number of Visits 16   Date for PT Re-Evaluation 10/29/16   Authorization Type U[employer] Medicare   PT Start Time 4166   PT Stop Time 1520   PT Time Calculation (min) 35 min   Activity Tolerance Patient tolerated treatment well   Behavior During Therapy [facility] for tasks assessed/performed      Past Medical History:  Diagnosis Date  . Arthritis   . Blood transfusion    for GI bleed 1987  . Diabetes mellitus   . Hypertension   . Thyroid disease     Past Surgical History:  Procedure Laterality Date  . rt knee arthroscopy    . THYROIDECTOMY, PARTIAL      There were no vitals filed for this visit.       Subjective Assessment - 09/03/16 1449    Subjective Pt is a 71 y/o who presents to OPPT for pre op evaluation of R UKA.  Surgery scheduled for Tues 09/08/16.  Pt presents today for her pre-op evaluation.   Pertinent History s/p microdiscketomy (4 yrs ago)- "I still have back problems"   Patient Stated Goals improve function and strength   Currently in Pain? Yes   Pain Score 7    Pain Location Knee   Pain Orientation Right   Pain Descriptors / Indicators Stabbing   Pain Type Chronic pain   Pain Onset More than a month ago   Pain Frequency Constant   Aggravating Factors  sitting   Pain Relieving Factors walking, moving            [facility] PT Assessment - 09/03/16 1452      Assessment   Medical Diagnosis R knee pain, upcoming R UKA   Referring Provider Dr. Corky Mull   Onset Date/Surgical Date 09/08/16   Next MD Visit 09/08/16   Prior Therapy prior arthroscopic surgery with rehab      Precautions   Precautions None     Restrictions   Weight Bearing Restrictions No     Balance Screen   Has the patient fallen in the past 6 months No   Has the patient had a decrease in activity level because of a fear of falling?  Yes   Is the patient reluctant to leave their home because of a fear of falling?  No     Home Environment   Living Environment Private residence   Living Arrangements Spouse/significant other   Available Help at Discharge Family;Available 24 hours/day   Type of Home House   Home Access Stairs to enter   Entrance Stairs-Number of Steps 1   Home Layout One level   [place] - 2 wheels;Bedside commode     Prior Function   Level of Independence Independent   Vocation Retired;Other (comment)   Vocation Requirements frequently travels to [place] to care for elderly parents   Leisure travel     Cognition   Overall Cognitive Status Within Functional Limits for tasks assessed     Observation/Other Assessments   Focus on Therapeutic Outcomes (FOTO)  44 (56% limited; predicted 45% limited)     AROM  AROM Assessment Site Knee   Right/Left Knee Right   Right Knee Extension 2   Right Knee Flexion 130     Strength   Strength Assessment Site Hip;Knee   Right/Left Hip Right;Left   Right Hip Flexion 3+/5   Right Hip Extension 3-/5   Right Hip ABduction 3+/5   Left Hip Flexion 3+/5   Left Hip Extension 3/5   Left Hip ABduction 4/5   Right/Left Knee Right;Left   Right Knee Flexion 3/5   Right Knee Extension 3+/5   Left Knee Flexion 5/5   Left Knee Extension 5/5     Ambulation/Gait   Gait Pattern Antalgic                   [facility] Adult PT Treatment/Exercise - 09/03/16 1452      Self-Care   Self-Care Other Self-Care Comments   Other Self-Care Comments  instructed in pre and post surgery exercises     Exercises   Exercises Knee/Hip     Knee/Hip Exercises: Supine   Quad Sets Right;5 reps   Short Arc Quad Sets Right;10  reps   Heel Slides Right   Heel Slides Limitations instructed in AA following surgery   Heel Prop for Knee Extension Limitations instructed in conjunction with ice post surgery   Straight Leg Raises Right;5 reps                PT Education - 09/03/16 1524    Education provided Yes   Education Details HEP, post surgery guidelines   Person(s) Educated Patient   Methods Explanation;Demonstration;Handout   Comprehension Verbalized understanding;Returned demonstration          PT Short Term Goals - 09/03/16 1527      PT SHORT TERM GOAL #1   Title independent with initial HEP (09/10/16)   Time 1   Period Weeks   Status New           PT Long Term Goals - 09/03/16 1527      PT LONG TERM GOAL #1   Title formal LTGs to follow at post op appointment   Status New               Plan - 09/03/16 1524    Clinical Impression Statement Pt is a 71 y/o female who presents to OPPT for low complexity PT eval for pre-op evaluation for R UKA.  Pt scheduled for surgery 09/08/16.  Session today focused on post op education and exercises to perform before and after surgery.  Additional goals to follow after surgery.   Rehab Potential Good   PT Frequency 2x / week   PT Duration 8 weeks   PT Treatment/Interventions ADLs/Self Care Home Management;Cryotherapy;Electrical Stimulation;Moist Heat;Therapeutic exercise;Therapeutic activities;Functional mobility training;Stair training;Gait training;Patient/family education;Manual techniques;Scar mobilization;Passive range of motion;Vasopneumatic Device;Taping;Dry needling   PT Next Visit Plan reassess ROM, gait; review and add to HEP.  will need additional goals   Consulted and Agree with Plan of Care Patient      Patient will benefit from skilled therapeutic intervention in order to improve the following deficits and impairments:  Abnormal gait, Decreased mobility, Decreased range of motion, Decreased strength, Difficulty walking,  Pain  Visit Diagnosis: Acute pain of right knee - Plan: PT plan of care cert/re-cert  Other abnormalities of gait and mobility - Plan: PT plan of care cert/re-cert  Stiffness of right knee, not elsewhere classified - Plan: PT plan of care cert/re-cert     Problem List Patient Active  Problem List   Diagnosis Date Noted  . Hyperglycemia 04/24/2014  . Hypokalemia 09/02/2013  . Routine general medical examination at a health care facility 09/01/2013  . Low back pain 08/31/2012  . Encounter for long-term (current) use of other medications 08/13/2011  . 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 Numbness 08/13/2011  . HYPERCHOLESTEROLEMIA 08/11/2010  . Asymptomatic postmenopausal status 08/03/2008  . Anemia 08/03/2007  . NONSPECIFIC ABNORMAL ELECTROCARDIOGRAM 08/03/2007  . HYPERTENSION 02/19/2007  . ALLERGIC RHINITIS 02/19/2007  . OSTEOARTHRITIS 02/19/2007      Laureen Abrahams, PT, DPT 09/03/16 3:30 PM    [facility] [place], [place], [address] Phone: [phone]   Fax:  [phone]  Name: [name] MRN: [id number] Date of Birth: 07-29-1945

## 2016-09-03 NOTE — Patient Instructions (Signed)
   POSITIONING :  Elevate leg above heart, with knee resting into extension.  Place pillow under ankle, never under knee.     Ankle Pump  Bend ankles up and down, alternating feet. Repeat __10-15__ times. Do __2-3__ sessions per day.  Quad Set  With other leg bent, foot flat, slowly tighten muscles on thigh of straight leg while counting out loud to _5___.  Repeat __10-15__ times. Do __2-3__ sessions per day.  KNEE: Extension, Short Arc Quads - Supine    Place bolster under knees. Raise one leg until knee is straight. _10-15__ reps per set, _2-3__ sets per day, _7__ days per week   Copyright  VHI. All rights reserved.    Hip Flexion / Knee Extension: Straight-Leg Raise (Eccentric)  Lie on back. Lift leg with knee straight. Slowly lower leg. _10-15__ reps per set, __2-3_ sets per day, _7__ days per week.   When using ice:  ROM: Heel Prop    Lie with pillow under right heel. Hold as long as you can with ice on your knee.  Shoot for up to 5 min if tolerated. Do every time you ice.  http://gt2.exer.us/288   Copyright  VHI. All rights reserved.     Do this after surgery:  Heel Slide  Bend left knee and pull heel toward buttocks. Use sheet, strap, or rope to actively assist knee further into flexion. Hold stretch 10 secs.  Repeat __10-15_ times. Do __2-3__ sessions per day.

## 2016-09-08 DIAGNOSIS — M1711 Unilateral primary osteoarthritis, right knee: Secondary | ICD-10-CM | POA: Diagnosis not present

## 2016-09-08 DIAGNOSIS — I10 Essential (primary) hypertension: Secondary | ICD-10-CM | POA: Diagnosis not present

## 2016-09-08 DIAGNOSIS — Z96651 Presence of right artificial knee joint: Secondary | ICD-10-CM | POA: Diagnosis not present

## 2016-09-08 DIAGNOSIS — Z79899 Other long term (current) drug therapy: Secondary | ICD-10-CM | POA: Diagnosis not present

## 2016-09-08 DIAGNOSIS — E785 Hyperlipidemia, unspecified: Secondary | ICD-10-CM | POA: Diagnosis not present

## 2016-09-08 DIAGNOSIS — E039 Hypothyroidism, unspecified: Secondary | ICD-10-CM | POA: Diagnosis not present

## 2016-09-08 DIAGNOSIS — Z881 Allergy status to other antibiotic agents status: Secondary | ICD-10-CM | POA: Diagnosis not present

## 2016-09-08 DIAGNOSIS — Z87891 Personal history of nicotine dependence: Secondary | ICD-10-CM | POA: Diagnosis not present

## 2016-09-08 DIAGNOSIS — D649 Anemia, unspecified: Secondary | ICD-10-CM | POA: Diagnosis not present

## 2016-09-08 DIAGNOSIS — Z9189 Other specified personal risk factors, not elsewhere classified: Secondary | ICD-10-CM | POA: Diagnosis not present

## 2016-09-08 DIAGNOSIS — Z791 Long term (current) use of non-steroidal anti-inflammatories (NSAID): Secondary | ICD-10-CM | POA: Diagnosis not present

## 2016-09-08 DIAGNOSIS — E119 Type 2 diabetes mellitus without complications: Secondary | ICD-10-CM | POA: Diagnosis not present

## 2016-09-08 DIAGNOSIS — G8918 Other acute postprocedural pain: Secondary | ICD-10-CM | POA: Diagnosis not present

## 2016-09-08 DIAGNOSIS — Z885 Allergy status to narcotic agent status: Secondary | ICD-10-CM | POA: Diagnosis not present

## 2016-09-09 DIAGNOSIS — E785 Hyperlipidemia, unspecified: Secondary | ICD-10-CM | POA: Diagnosis not present

## 2016-09-09 DIAGNOSIS — E119 Type 2 diabetes mellitus without complications: Secondary | ICD-10-CM | POA: Diagnosis not present

## 2016-09-09 DIAGNOSIS — Z79899 Other long term (current) drug therapy: Secondary | ICD-10-CM | POA: Diagnosis not present

## 2016-09-09 DIAGNOSIS — M1711 Unilateral primary osteoarthritis, right knee: Secondary | ICD-10-CM | POA: Diagnosis not present

## 2016-09-09 DIAGNOSIS — I10 Essential (primary) hypertension: Secondary | ICD-10-CM | POA: Diagnosis not present

## 2016-09-09 DIAGNOSIS — Z9189 Other specified personal risk factors, not elsewhere classified: Secondary | ICD-10-CM | POA: Diagnosis not present

## 2016-09-09 DIAGNOSIS — Z87891 Personal history of nicotine dependence: Secondary | ICD-10-CM | POA: Diagnosis not present

## 2016-09-09 DIAGNOSIS — E039 Hypothyroidism, unspecified: Secondary | ICD-10-CM | POA: Diagnosis not present

## 2016-09-09 DIAGNOSIS — Z881 Allergy status to other antibiotic agents status: Secondary | ICD-10-CM | POA: Diagnosis not present

## 2016-09-09 DIAGNOSIS — Z791 Long term (current) use of non-steroidal anti-inflammatories (NSAID): Secondary | ICD-10-CM | POA: Diagnosis not present

## 2016-09-09 DIAGNOSIS — D649 Anemia, unspecified: Secondary | ICD-10-CM | POA: Diagnosis not present

## 2016-09-09 DIAGNOSIS — Z885 Allergy status to narcotic agent status: Secondary | ICD-10-CM | POA: Diagnosis not present

## 2016-09-10 ENCOUNTER — Encounter: Payer: Self-pay | Admitting: Rehabilitative and Restorative Service Providers"

## 2016-09-10 ENCOUNTER — Ambulatory Visit (INDEPENDENT_AMBULATORY_CARE_PROVIDER_SITE_OTHER): Payer: Medicare Other | Admitting: Rehabilitative and Restorative Service Providers"

## 2016-09-10 DIAGNOSIS — M25661 Stiffness of right knee, not elsewhere classified: Secondary | ICD-10-CM | POA: Diagnosis not present

## 2016-09-10 DIAGNOSIS — M25561 Pain in right knee: Secondary | ICD-10-CM | POA: Diagnosis not present

## 2016-09-10 DIAGNOSIS — R2689 Other abnormalities of gait and mobility: Secondary | ICD-10-CM

## 2016-09-10 NOTE — Therapy (Signed)
[place], [place], [address] Phone: [phone]   Fax:  [phone]  Physical Therapy Treatment  Patient Details  Name: [name] MRN: [id number] Date of Birth: [birth year] Referring Provider: Dr Corky Mull  Encounter Date: 09/10/2016      PT End of Session - 09/10/16 1444    Visit Number 2   Number of Visits 18   Date for PT Re-Evaluation 11/05/16   Authorization Type UHC Medicare - G code at 10 th visit    PT Start Time 1445   PT Stop Time 1535   PT Time Calculation (min) 50 min   Activity Tolerance Patient tolerated treatment well      Past Medical History:  Diagnosis Date  . Arthritis   . Blood transfusion    for GI bleed 1987  . Diabetes mellitus   . Hypertension   . Thyroid disease     Past Surgical History:  Procedure Laterality Date  . rt knee arthroscopy    . THYROIDECTOMY, PARTIAL      There were no vitals filed for this visit.      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 she did well with surgery. Continues with pain meds and has started some exercises at home. Ready to get going with therapy. Pt states that MD told her to "go easy" with therapy - does not want her to run a marathon - just "get back to where she was".    Pertinent History s/p microdiscketomy (4 yrs ago)- "I still have back problems"   Patient Stated Goals improve function and strength   Currently in Pain? Yes   Pain Score 8    Pain Location Knee   Pain Orientation Right   Pain Descriptors / Indicators Sharp   Pain Type Surgical pain;Chronic pain   Pain Onset More than a month ago   Pain Frequency Constant   Aggravating Factors  sitting; standing; walking   Pain Relieving Factors moving;             [facility] PT Assessment - 09/10/16 0001      Assessment   Medical Diagnosis Rt UKA     Referring Provider Dr Corky Mull   Onset Date/Surgical Date 09/08/16   Hand Dominance Right   Next MD Visit 09/22/16   Prior Therapy prior arthroscopic surgery with rehab     Precautions   Precautions None     Restrictions   Weight Bearing Restrictions No     Balance Screen   Has the patient fallen in the past 6 months No   Has the patient had a decrease in activity level because of a fear of falling?  No   Is the patient reluctant to leave their home because of a fear of falling?  No     Home Environment   Living Environment Private residence   Living Arrangements Spouse/significant other   Available Help at Discharge Family;Available 24 hours/day   Type of Home House   Home Access Stairs to enter   Entrance Stairs-Number of Steps 1   Home Layout One level   [place] - 2 wheels;Bedside commode     Prior Function   Level of Independence Independent   Vocation Retired;Other (comment)   Vocation Requirements frequently travels to [place] to care for elderly parents   Leisure travel; household chores and activities  Observation/Other Assessments   Focus on Therapeutic Outcomes (FOTO)  56% limitation      AROM   AROM Assessment Site Knee   Right/Left Knee Right   Right Knee Extension -9   Right Knee Flexion 96     Strength   Strength Assessment Site --  see prior assessment - not tested post op day 2      Ambulation/Gait   Gait Pattern Antalgic  ambulates with rolling walker - poor gait pattern                      [facility] Adult PT Treatment/Exercise - 09/10/16 0001      Self-Care   Other Self-Care Comments  advised pt to contact MD office re support socks she is suppose to be wearing but is unable to get on      Knee/Hip Exercises: Stretches   Gastroc Stretch 3 reps;20 seconds     Knee/Hip Exercises: Standing   Hip Flexion AROM;Left;Right;20 reps  marching - standing in walker      Knee/Hip Exercises: Seated   Knee/Hip  Flexion sitting at edge of table scooting forward to stretch into knee flexion 10 sec x 5      Knee/Hip Exercises: Supine   Heel Slides Right;5 reps     Cryotherapy   Number Minutes Cryotherapy 15 Minutes   Cryotherapy Location Knee  Rt   Type of Cryotherapy Ice pack                PT Education - 09/10/16 1510    Education provided Yes   Education Details HEP    Person(s) Educated Patient   Methods Explanation;Demonstration;Tactile cues;Verbal cues;Handout   Comprehension Verbalized understanding;Returned demonstration;Verbal cues required;Tactile cues required          PT Short Term Goals - 09/10/16 1516      PT SHORT TERM GOAL #1   Title independent with initial HEP (10/08/16)   Time 4   Period Weeks   Status New     PT SHORT TERM GOAL #2   Title Increase AROM Rt knee 0-120 degrees 10/08/16   Time 4   Period Weeks   Status New     PT SHORT TERM GOAL #3   Title 4/5 to 4+/5 strength Rt LE 10/08/16   Time 4   Period Weeks   Status New     PT SHORT TERM GOAL #4   Title Ambulate with single point cane for 200-300 ft with good gait pattern 4/19;18   Time 4   Period Weeks   Status New           PT Long Term Goals - 09/10/16 1518      PT LONG TERM GOAL #1   Title Ambulate without assistive device with good gait pattern 11/05/16   Time 8   Period Weeks   Status New     PT LONG TERM GOAL #2   Title 4+/5 to 5/5 strength Rt LE 11/05/16   Time 8   Period Weeks   Status New     PT LONG TERM GOAL #3   Title AROM Rt knee 0-130 degrees 11/05/16   Time 8   Period Weeks   Status New     PT LONG TERM GOAL #4   Title Independent in HEP 11/05/16   Time 8   Period Weeks   Status New     PT LONG TERM GOAL #5   Title Improve FOTO to </=  45% limitation  11/05/16   Time 8   Period Weeks   Status New             Patient will benefit from skilled therapeutic intervention in order to improve the following deficits and impairments:     Visit  Diagnosis: Acute pain of right knee - Plan: PT plan of care cert/re-cert  Other abnormalities of gait and mobility - Plan: PT plan of care cert/re-cert  Stiffness of right knee, not elsewhere classified - Plan: PT plan of care cert/re-cert     Problem List Patient Active Problem List   Diagnosis Date Noted  . Hyperglycemia 04/24/2014  . Hypokalemia 09/02/2013  . Routine general medical examination at a health care facility 09/01/2013  . Low back pain 08/31/2012  . Encounter for long-term (current) use of other medications 08/13/2011  . 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 Numbness 08/13/2011  . HYPERCHOLESTEROLEMIA 08/11/2010  . Asymptomatic postmenopausal status 08/03/2008  . Anemia 08/03/2007  . NONSPECIFIC ABNORMAL ELECTROCARDIOGRAM 08/03/2007  . HYPERTENSION 02/19/2007  . ALLERGIC RHINITIS 02/19/2007  . OSTEOARTHRITIS 02/19/2007    Aundray Cartlidge Nilda Simmer PT, MPH  09/10/2016, 3:24 PM  [facility] [place], [place], [address] Phone: [phone]   Fax:  [phone]  Name: [name] MRN: [id number] Date of Birth: 1945-07-16

## 2016-09-10 NOTE — Patient Instructions (Signed)
Achilles / Gastroc, Standing    Stand, right foot behind, heel on floor and turned slightly out, leg straight, forward leg bent. Move hips forward. Hold _30__ seconds. Repeat _3__ times per session. Do _2-4__ sessions per day.  Sitting on the edge of bed or in a chair and let your knee bend  Hold for 10-20 sec  Repeat 5-10 times - 3-4 times a day   Marching  - standing at walker  Lifting knee up one then the other  20 each leg 2-3 times a day   Continue with other exercises from handouts form hospital and out patient

## 2016-09-15 ENCOUNTER — Ambulatory Visit (INDEPENDENT_AMBULATORY_CARE_PROVIDER_SITE_OTHER): Payer: Medicare Other | Admitting: Physical Therapy

## 2016-09-15 DIAGNOSIS — M25661 Stiffness of right knee, not elsewhere classified: Secondary | ICD-10-CM | POA: Diagnosis not present

## 2016-09-15 DIAGNOSIS — R2689 Other abnormalities of gait and mobility: Secondary | ICD-10-CM | POA: Diagnosis not present

## 2016-09-15 DIAGNOSIS — M25561 Pain in right knee: Secondary | ICD-10-CM | POA: Diagnosis not present

## 2016-09-15 NOTE — Therapy (Signed)
[place], [place], [address] Phone: [phone]   Fax:  [phone]  Physical Therapy Treatment  Patient Details  Name: [name] MRN: [id number] Date of Birth: 07-03-1945 Referring Provider: Dr Corky Mull  Encounter Date: 09/15/2016      PT End of Session - 09/15/16 1105    Visit Number 3   Number of Visits 18   Date for PT Re-Evaluation 11/05/16   PT Start Time 1105   PT Stop Time 1200   PT Time Calculation (min) 55 min   Activity Tolerance Patient tolerated treatment well   Behavior During Therapy [facility] for tasks assessed/performed      Past Medical History:  Diagnosis Date  . Arthritis   . Blood transfusion    for GI bleed 1987  . Diabetes mellitus   . Hypertension   . Thyroid disease     Past Surgical History:  Procedure Laterality Date  . rt knee arthroscopy    . THYROIDECTOMY, PARTIAL      There were no vitals filed for this visit.      Subjective Assessment - 09/15/16 1113    Subjective Pt reports she has continued to do HEP 2x/day.  Currently taking pain meds 2x/day (1 pill). She uses RW in community at all times, and around house when leg is fatigued.  She complains of bruising in Rt ankle and hip.     Currently in Pain? Yes   Pain Score 2    Pain Location Knee   Pain Orientation Right   Pain Descriptors / Indicators Sharp   Aggravating Factors  lifting leg    Pain Relieving Factors ice, straightening knee out.             [facility] PT Assessment - 09/15/16 0001      AROM   AROM Assessment Site Knee   Right/Left Knee Right   Right Knee Extension -5   Right Knee Flexion 100           [facility] Adult PT Treatment/Exercise - 09/15/16 0001      Knee/Hip Exercises: Stretches   Passive Hamstring Stretch Right;3 reps;30 seconds     Knee/Hip Exercises: Aerobic   Recumbent Bike unable to tolerate due to back pain.    Nustep L4: slow pace x 5 min      Knee/Hip Exercises: Seated   Long Arc Quad AROM;Right;1 set;10 reps   Knee/Hip Flexion sitting at edge of table scooting forward to stretch into knee flexion 10 sec x 5      Knee/Hip Exercises: Supine   Quad Sets Right;1 set;10 reps   Heel Slides Right;10 reps  with strap   Straight Leg Raises Right;5 reps  AAROM to AROM. 2 sets   Straight Leg Raises Limitations VC for quad set prior to raising leg   Other Supine Knee/Hip Exercises Pt educated on supine to/from sit via log roll; pt verbalized understanding and returned demo.      Modalities   Modalities Vasopneumatic     Vasopneumatic   Number Minutes Vasopneumatic  15 minutes   Vasopnuematic Location  Knee  Rt   Vasopneumatic Pressure Low   Vasopneumatic Temperature  34 deg     Manual Therapy   Manual Therapy Taping   Manual therapy comments Sensitive skin Rock tape applied with 10% stretch to Rt lateral ankle and Rt lateral hip to decrease bruising.  PT Short Term Goals - 09/15/16 1257      PT SHORT TERM GOAL #1   Title independent with initial HEP (10/08/16)   Time 4   Period Weeks   Status On-going     PT SHORT TERM GOAL #2   Title Increase AROM Rt knee 0-120 degrees 10/08/16   Time 4   Period Weeks   Status On-going     PT SHORT TERM GOAL #3   Title 4/5 to 4+/5 strength Rt LE 10/08/16   Time 4   Period Weeks   Status On-going     PT SHORT TERM GOAL #4   Title Ambulate with single point cane for 200-300 ft with good gait pattern 4/19;18   Time 4   Period Weeks   Status On-going           PT Long Term Goals - 09/15/16 1258      PT LONG TERM GOAL #1   Title Ambulate without assistive device with good gait pattern 11/05/16   Time 8   Period Weeks   Status On-going     PT LONG TERM GOAL #2   Title 4+/5 to 5/5 strength Rt LE 11/05/16   Time 8   Period Weeks   Status On-going     PT LONG TERM GOAL #3   Title AROM Rt knee 0-130 degrees 11/05/16   Time 8   Period Weeks    Status On-going     PT LONG TERM GOAL #4   Title Independent in HEP 11/05/16   Time 8   Period Weeks   Status On-going     PT LONG TERM GOAL #5   Title Improve FOTO to </= 45% limitation  11/05/16   Time 8   Period Weeks   Status On-going               Plan - 09/15/16 1247    Clinical Impression Statement Pt hesistant to push strengthening/ROM for Rt knee; required some encouragement. She tolerated all exercises with min increase in pain.  Trial of Rock tape applied to Rt ankle and lateral Rt hip where bruising is present.    Rehab Potential Good   PT Frequency 2x / week   PT Duration 8 weeks   PT Treatment/Interventions ADLs/Self Care Home Management;Cryotherapy;Electrical Stimulation;Moist Heat;Therapeutic exercise;Therapeutic activities;Functional mobility training;Stair training;Gait training;Patient/family education;Manual techniques;Scar mobilization;Passive range of motion;Vasopneumatic Device;Taping;Dry needling   PT Next Visit Plan Consolidate/simplify HEP.  Continue progressive Rt knee ROM/strengthening/gait.    Consulted and Agree with Plan of Care Patient      Patient will benefit from skilled therapeutic intervention in order to improve the following deficits and impairments:  Abnormal gait, Decreased mobility, Decreased range of motion, Decreased strength, Difficulty walking, Pain  Visit Diagnosis: Acute pain of right knee  Other abnormalities of gait and mobility  Stiffness of right knee, not elsewhere classified     Problem List Patient Active Problem List   Diagnosis Date Noted  . Hyperglycemia 04/24/2014  . Hypokalemia 09/02/2013  . Routine general medical examination at a health care facility 09/01/2013  . Low back pain 08/31/2012  . Encounter for long-term (current) use of other medications 08/13/2011  . 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 Numbness 08/13/2011  . HYPERCHOLESTEROLEMIA 08/11/2010  . Asymptomatic postmenopausal status  08/03/2008  . Anemia 08/03/2007  . NONSPECIFIC ABNORMAL ELECTROCARDIOGRAM 08/03/2007  . HYPERTENSION 02/19/2007  . ALLERGIC RHINITIS 02/19/2007  . OSTEOARTHRITIS 02/19/2007   Kerin Perna, PTA 09/15/16 12:59 PM  [place], [place], [address] Phone: [phone]   Fax:  [phone]  Name: [name] MRN: [id number] Date of Birth: 16-Oct-1945

## 2016-09-16 ENCOUNTER — Ambulatory Visit (INDEPENDENT_AMBULATORY_CARE_PROVIDER_SITE_OTHER): Payer: Medicare Other | Admitting: Rehabilitative and Restorative Service Providers"

## 2016-09-16 ENCOUNTER — Encounter: Payer: Self-pay | Admitting: Rehabilitative and Restorative Service Providers"

## 2016-09-16 DIAGNOSIS — M25561 Pain in right knee: Secondary | ICD-10-CM

## 2016-09-16 DIAGNOSIS — R2689 Other abnormalities of gait and mobility: Secondary | ICD-10-CM | POA: Diagnosis not present

## 2016-09-16 DIAGNOSIS — M25661 Stiffness of right knee, not elsewhere classified: Secondary | ICD-10-CM | POA: Diagnosis not present

## 2016-09-16 NOTE — Patient Instructions (Addendum)
Quad Set - to help straighten knee    With other Left leg bent, foot flat, slowly tighten muscles on Right thigh of straight leg.  Count out loud to _5-10___. Repeat __10__ times. Do _several ___ sessions per day. Can prop foot up on pillow or stool.    Straight Leg Raise    Tighten stomach and Right knee and slowly raise locked right leg __10__ inches from floor.   Repeat __5-10__ times per set. Do __2__ sets per session. Do __2__ sessions per day.  Supine: Leg Stretch With Strap (Intermediate)    Lie on back with one knee bent, foot flat on floor. Hook strap around other foot. Straighten knee. Raise leg further toward its maximal range. Hold _30__ seconds. Relax leg completely down to floor.  Repeat _3__ times per session. Do _2__ sessions per day.  Sitting Scoot    Scoot to the front of chair,hold 10 seconds then scoot back. Repeat _10___ times. Do __2_ sessions per day.  Calf Stretch    Place hands on wall at shoulder height. Keeping back leg straight, bend front leg, feet pointing forward, heels flat on floor. Lean forward slightly until stretch is felt in calf of back leg. Hold stretch __30_ seconds, breathing slowly in and out. Repeat stretch with other leg back. Repeat 2 times. Do __2_ sessions per day. Variation: Use chair or table for support. SIT TO STAND: No Device    Sit with feet shoulder-width apart, on floor. Lean chest forward, raise hips up from surface. Straighten hips and knees. Weight bear equally on left and right sides. __10_ reps per set, _2__ sets per day, _7__ days per week Place left leg closer to sitting surface.   [facility] [address], [place] [address]  [phone] (office) [phone] (fax)

## 2016-09-16 NOTE — Therapy (Signed)
[place], [place], [address] Phone: [phone]   Fax:  [phone]  Physical Therapy Treatment  Patient Details  Name: [name] MRN: [id number] Date of Birth: Nov 15, 1945 Referring Provider: Dr Corky Mull  Encounter Date: 09/16/2016      PT End of Session - 09/16/16 1520    Visit Number 4   Number of Visits 18   Date for PT Re-Evaluation 11/05/16   Authorization Type [employer] Medicare - G code at 10 th visit    PT Start Time 1512   PT Stop Time 1603   PT Time Calculation (min) 51 min   Activity Tolerance Patient tolerated treatment well      Past Medical History:  Diagnosis Date  . Arthritis   . Blood transfusion    for GI bleed 1987  . Diabetes mellitus   . Hypertension   . Thyroid disease     Past Surgical History:  Procedure Laterality Date  . rt knee arthroscopy    . THYROIDECTOMY, PARTIAL      There were no vitals filed for this visit.      Subjective Assessment - 09/16/16 1515    Subjective [name] reports that she is still having some pulling across the knee with her bandage pulling. She is taking the bandage off tonight. Patient reports that she is working on her exercise at home. She returns to the doctor 09/22/16. Has not had a pain pil today at all - may take one tonight. Pain is worse toward the end of the day.    Currently in Pain? Yes   Pain Score 2    Pain Location Knee   Pain Orientation Right   Pain Descriptors / Indicators Aching  sharp with bending    Pain Type Surgical pain   Pain Onset More than a month ago   Pain Frequency Intermittent   Aggravating Factors  lifting leg    Pain Relieving Factors ice; straightening knee out             [facility] PT Assessment - 09/16/16 0001      Assessment   Medical Diagnosis Rt UKA    Referring Provider Dr Corky Mull   Onset Date/Surgical Date 09/08/16   Hand Dominance Right   Next MD Visit 09/22/16   Prior  Therapy prior arthroscopic surgery with rehab     AROM   Right Knee Extension -3   Right Knee Flexion 104                     [facility] Adult PT Treatment/Exercise - 09/16/16 0001      Therapeutic Activites    Therapeutic Activities --  myofacial ball release work standing - anterior hip      Knee/Hip Exercises: Stretches   Passive Hamstring Stretch Right;3 reps;30 seconds     Knee/Hip Exercises: Aerobic   Nustep L4: slow pace x 5 min      Knee/Hip Exercises: Seated   Knee/Hip Flexion sitting at edge of table scooting forward to stretch into knee flexion 10 sec x 5    Sit to Sand 5 reps  from elevated surface - high low table ~ 18-20 in     Knee/Hip Exercises: Supine   Quad Sets Right;1 set;10 reps   Heel Slides Right;10 reps  with strap   Straight Leg Raises Strengthening;Right;1 set;10 reps  5 count hold    Knee Extension Strengthening;Right;1 set;10 reps  terminal knee  extensioin thigh on 6 inch roll 5 sec hold      Moist Heat Therapy   Number Minutes Moist Heat 15 Minutes   Moist Heat Location --  chest - to tolerate cold for knee better      Vasopneumatic   Number Minutes Vasopneumatic  15 minutes   Vasopnuematic Location  Knee  Rt   Vasopneumatic Pressure Low   Vasopneumatic Temperature  34 deg     Manual Therapy   Manual Therapy Taping   Manual therapy comments Sensitive skin Rock tape applied with 10% stretch to Rt medial ankle and Rt posterior calf to decrease bruising.    Soft tissue mobilization working through Rt hip flexors proximal quad    Myofascial Release proximal anterior hip                 PT Education - 09/16/16 1542    Education provided Yes   Education Details revised and streamlined HEP    Person(s) Educated Patient   Methods Explanation;Demonstration;Tactile cues;Verbal cues;Handout   Comprehension Verbalized understanding;Returned demonstration;Verbal cues required;Tactile cues required          PT Short Term  Goals - 09/15/16 1257      PT SHORT TERM GOAL #1   Title independent with initial HEP (10/08/16)   Time 4   Period Weeks   Status On-going     PT SHORT TERM GOAL #2   Title Increase AROM Rt knee 0-120 degrees 10/08/16   Time 4   Period Weeks   Status On-going     PT SHORT TERM GOAL #3   Title 4/5 to 4+/5 strength Rt LE 10/08/16   Time 4   Period Weeks   Status On-going     PT SHORT TERM GOAL #4   Title Ambulate with single point cane for 200-300 ft with good gait pattern 4/19;18   Time 4   Period Weeks   Status On-going           PT Long Term Goals - 09/15/16 1258      PT LONG TERM GOAL #1   Title Ambulate without assistive device with good gait pattern 11/05/16   Time 8   Period Weeks   Status On-going     PT LONG TERM GOAL #2   Title 4+/5 to 5/5 strength Rt LE 11/05/16   Time 8   Period Weeks   Status On-going     PT LONG TERM GOAL #3   Title AROM Rt knee 0-130 degrees 11/05/16   Time 8   Period Weeks   Status On-going     PT LONG TERM GOAL #4   Title Independent in HEP 11/05/16   Time 8   Period Weeks   Status On-going     PT LONG TERM GOAL #5   Title Improve FOTO to </= 45% limitation  11/05/16   Time 8   Period Weeks   Status On-going               Plan - 09/16/16 1522    Clinical Impression Statement Continues to self limit with ROM/strengthening due to pain. Some improvement in bruising with rock tape in Rt ankle and hip. Will have post op bandage removed tonight which will hopefully help pt to tolerate exercise better - especialy knee flexion. Note significatn tightness through proximal anterior hip - flexors, which is where patient has pain/discomfort with exercises and functional activities. Hopefully she will respond well to myofacial release work in  clinic and at home.    Rehab Potential Good   PT Frequency 2x / week   PT Duration 8 weeks   PT Treatment/Interventions ADLs/Self Care Home Management;Cryotherapy;Electrical  Stimulation;Moist Heat;Therapeutic exercise;Therapeutic activities;Functional mobility training;Stair training;Gait training;Patient/family education;Manual techniques;Scar mobilization;Passive range of motion;Vasopneumatic Device;Taping;Dry needling   PT Next Visit Plan Review consolidated/simplified HEP as needed.  Continue progressive Rt knee ROM/strengthening/gait. Progress HEP as indicated. Address tightness through hip flexors as indicated    Consulted and Agree with Plan of Care Patient      Patient will benefit from skilled therapeutic intervention in order to improve the following deficits and impairments:  Abnormal gait, Decreased mobility, Decreased range of motion, Decreased strength, Difficulty walking, Pain  Visit Diagnosis: Acute pain of right knee  Other abnormalities of gait and mobility  Stiffness of right knee, not elsewhere classified     Problem List Patient Active Problem List   Diagnosis Date Noted  . Hyperglycemia 04/24/2014  . Hypokalemia 09/02/2013  . Routine general medical examination at a health care facility 09/01/2013  . Low back pain 08/31/2012  . Encounter for long-term (current) use of other medications 08/13/2011  . 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 Numbness 08/13/2011  . HYPERCHOLESTEROLEMIA 08/11/2010  . Asymptomatic postmenopausal status 08/03/2008  . Anemia 08/03/2007  . NONSPECIFIC ABNORMAL ELECTROCARDIOGRAM 08/03/2007  . HYPERTENSION 02/19/2007  . ALLERGIC RHINITIS 02/19/2007  . OSTEOARTHRITIS 02/19/2007    Lynleigh Kovack Nilda Simmer PT, MPH  09/16/2016, 4:01 PM  [facility] [place], [place], [address] Phone: [phone]   Fax:  [phone]  Name: [name] MRN: [id number] Date of Birth: Mar 21, 1946

## 2016-09-16 NOTE — Patient Instructions (Signed)
Bracing With Heel Slides (Supine)   Can use belt to slide leg up  With neutral spine, tighten pelvic floor and abdominals and hold. Alternating legs, slide heel to bottom. Repeat _10__ times. Do _2__ times a day.

## 2016-09-21 ENCOUNTER — Ambulatory Visit (INDEPENDENT_AMBULATORY_CARE_PROVIDER_SITE_OTHER): Payer: Medicare Other | Admitting: Rehabilitative and Restorative Service Providers"

## 2016-09-21 ENCOUNTER — Encounter: Payer: Self-pay | Admitting: Rehabilitative and Restorative Service Providers"

## 2016-09-21 DIAGNOSIS — R2689 Other abnormalities of gait and mobility: Secondary | ICD-10-CM | POA: Diagnosis not present

## 2016-09-21 DIAGNOSIS — M25661 Stiffness of right knee, not elsewhere classified: Secondary | ICD-10-CM | POA: Diagnosis not present

## 2016-09-21 DIAGNOSIS — Z96651 Presence of right artificial knee joint: Secondary | ICD-10-CM | POA: Insufficient documentation

## 2016-09-21 DIAGNOSIS — M25561 Pain in right knee: Secondary | ICD-10-CM | POA: Diagnosis not present

## 2016-09-21 NOTE — Therapy (Signed)
[place], [place], [address] Phone: [phone]   Fax:  [phone]  Physical Therapy Treatment  Patient Details  Name: [name] MRN: [id number] Date of Birth: [birth year] Referring Provider: Dr Corky Mull   Encounter Date: 09/21/2016      PT End of Session - 09/21/16 1434    Visit Number 5   Number of Visits 18   Date for PT Re-Evaluation 11/05/16   Authorization Type [employer] Medicare - G code at 10 th visit    PT Start Time 1434   PT Stop Time 1530   PT Time Calculation (min) 56 min   Activity Tolerance Patient tolerated treatment well      Past Medical History:  Diagnosis Date  . Arthritis   . Blood transfusion    for GI bleed 1987  . Diabetes mellitus   . Hypertension   . Thyroid disease     Past Surgical History:  Procedure Laterality Date  . rt knee arthroscopy    . THYROIDECTOMY, PARTIAL      There were no vitals filed for this visit.      Subjective Assessment - 09/21/16 1435    Subjective [name] reports that she continues to take 1-2 pain pills a day - usually at night. She is still not sleeping well. Returns to MD tomorrow. Removed the bandage but is keeping the incision covered until she sees MD again tomorrow. She is still using the walker for any distance.    Currently in Pain? No/denies            [facility] PT Assessment - 09/21/16 0001      Assessment   Medical Diagnosis Rt UKA    Referring Provider Dr Corky Mull    Onset Date/Surgical Date 09/08/16   Hand Dominance Right   Next MD Visit 09/22/16   Prior Therapy prior arthroscopic surgery with rehab     Observation/Other Assessments   Observations Improving bruising through calf and ankle areas      AROM   Right Knee Extension -2   Right Knee Flexion 117     Strength   Right Hip Flexion 4-/5   Right Hip Extension 3/5   Right Hip ABduction 4-/5   Right Hip ADduction 4/5   Right Knee Flexion 4/5    Right Knee Extension 4/5                     [facility] Adult PT Treatment/Exercise - 09/21/16 0001      Knee/Hip Exercises: Stretches   Passive Hamstring Stretch Right;3 reps;30 seconds     Knee/Hip Exercises: Aerobic   Nustep L5 x 7 min      Knee/Hip Exercises: Standing   Hip Flexion AROM;Right;Left;10 reps;2 sets  alternating toe tap to 14 inch step    Hip Abduction Stengthening;Right;Left;10 reps;2 sets  leading out with heel    Hip Extension Stengthening;Right;Left;10 reps;2 sets   Lateral Step Up Right;5 reps;Hand Hold: 2  using UE's to pull up    Forward Step Up Right;10 reps;2 sets;Hand Hold: 2;Step Height: 4"     Knee/Hip Exercises: Seated   Other Seated Knee/Hip Exercises knee flexion scooting forward at table 20 sec x 5    Sit to Sand 5 reps  from elevated surface -low table w/2" cushion ~ 16-18 in     Knee/Hip Exercises: Supine   Quad Sets Right;1 set;10 reps   Heel Slides Right;10 reps  with  strap     Moist Heat Therapy   Number Minutes Moist Heat 15 Minutes   Moist Heat Location Hip  anterior Rt hip/chest - to tolerate cold for knee better      Vasopneumatic   Number Minutes Vasopneumatic  15 minutes   Vasopnuematic Location  Knee  Rt   Vasopneumatic Pressure Low   Vasopneumatic Temperature  34 deg                PT Education - 09/21/16 1529    Education provided Yes   Education Details reviewed HEP and discussed importance of continuing with HEP - even though we will be working on different exercises in PT    Person(s) Educated Patient   Methods Explanation   Comprehension Verbalized understanding          PT Short Term Goals - 09/15/16 1257      PT SHORT TERM GOAL #1   Title independent with initial HEP (10/08/16)   Time 4   Period Weeks   Status On-going     PT SHORT TERM GOAL #2   Title Increase AROM Rt knee 0-120 degrees 10/08/16   Time 4   Period Weeks   Status On-going     PT SHORT TERM GOAL #3   Title 4/5 to  4+/5 strength Rt LE 10/08/16   Time 4   Period Weeks   Status On-going     PT SHORT TERM GOAL #4   Title Ambulate with single point cane for 200-300 ft with good gait pattern 4/19;18   Time 4   Period Weeks   Status On-going           PT Long Term Goals - 09/15/16 1258      PT LONG TERM GOAL #1   Title Ambulate without assistive device with good gait pattern 11/05/16   Time 8   Period Weeks   Status On-going     PT LONG TERM GOAL #2   Title 4+/5 to 5/5 strength Rt LE 11/05/16   Time 8   Period Weeks   Status On-going     PT LONG TERM GOAL #3   Title AROM Rt knee 0-130 degrees 11/05/16   Time 8   Period Weeks   Status On-going     PT LONG TERM GOAL #4   Title Independent in HEP 11/05/16   Time 8   Period Weeks   Status On-going     PT LONG TERM GOAL #5   Title Improve FOTO to </= 45% limitation  11/05/16   Time 8   Period Weeks   Status On-going               Plan - 09/21/16 1441    Clinical Impression Statement [name] continues to self-limit with ROM and strengthening due to pain and avoidance of pain. Bruising continues to improve. She tolerates exercise fairly well. Trial of pain med with therapy today to assess response and tolerance for exercise and ROM. Note continued tightness through the proximal anterior hip through hip flexors as well as the quads. These areas are where [facility] feels pain when she reports pain. She is gradually progressing toward stated goals of therapy.    Rehab Potential Good   PT Frequency 2x / week   PT Duration 8 weeks   PT Treatment/Interventions ADLs/Self Care Home Management;Cryotherapy;Electrical Stimulation;Moist Heat;Therapeutic exercise;Therapeutic activities;Functional mobility training;Stair training;Gait training;Patient/family education;Manual techniques;Scar mobilization;Passive range of motion;Vasopneumatic Device;Taping;Dry needling   PT Next Visit Plan  Continue to review consolidated/simplified HEP as needed.  Progressive Rt knee ROM/strengthening/gait. Progress HEP as indicated. Address tightness through hip flexors as indicated    Consulted and Agree with Plan of Care Patient      Patient will benefit from skilled therapeutic intervention in order to improve the following deficits and impairments:  Abnormal gait, Decreased mobility, Decreased range of motion, Decreased strength, Difficulty walking, Pain  Visit Diagnosis: Acute pain of right knee  Other abnormalities of gait and mobility  Stiffness of right knee, not elsewhere classified     Problem List Patient Active Problem List   Diagnosis Date Noted  . Hyperglycemia 04/24/2014  . Hypokalemia 09/02/2013  . Routine general medical examination at a health care facility 09/01/2013  . Low back pain 08/31/2012  . Encounter for long-term (current) use of other medications 08/13/2011  . 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 Numbness 08/13/2011  . HYPERCHOLESTEROLEMIA 08/11/2010  . Asymptomatic postmenopausal status 08/03/2008  . Anemia 08/03/2007  . NONSPECIFIC ABNORMAL ELECTROCARDIOGRAM 08/03/2007  . HYPERTENSION 02/19/2007  . ALLERGIC RHINITIS 02/19/2007  . OSTEOARTHRITIS 02/19/2007    [name] Nilda Simmer PT, MPH  09/21/2016, 3:31 PM  [facility] [place], [place], [address] Phone: [phone]   Fax:  [phone]  Name: [name] MRN: [id number] Date of Birth: 1945/08/29

## 2016-09-22 DIAGNOSIS — Z96651 Presence of right artificial knee joint: Secondary | ICD-10-CM | POA: Diagnosis not present

## 2016-09-22 DIAGNOSIS — Z471 Aftercare following joint replacement surgery: Secondary | ICD-10-CM | POA: Diagnosis not present

## 2016-09-24 ENCOUNTER — Encounter: Payer: Self-pay | Admitting: Rehabilitative and Restorative Service Providers"

## 2016-09-24 ENCOUNTER — Ambulatory Visit (INDEPENDENT_AMBULATORY_CARE_PROVIDER_SITE_OTHER): Payer: Medicare Other | Admitting: Rehabilitative and Restorative Service Providers"

## 2016-09-24 DIAGNOSIS — M25661 Stiffness of right knee, not elsewhere classified: Secondary | ICD-10-CM | POA: Diagnosis not present

## 2016-09-24 DIAGNOSIS — M25561 Pain in right knee: Secondary | ICD-10-CM

## 2016-09-24 DIAGNOSIS — R2689 Other abnormalities of gait and mobility: Secondary | ICD-10-CM | POA: Diagnosis not present

## 2016-09-24 NOTE — Patient Instructions (Addendum)
Strengthening: Hip Abduction - Resisted    With tubing around thighs, other side toward anchor, extend leg out from side. Repeat __10__ times per set. Do __2-3__ sets per session. Do _1___ sessions per day.   .  Strengthening: Hip Extension - Resisted    With tubing around thighs, face anchor and pull leg straight back. Repeat _10___ times per set. Do _2-3___ sets per session. Do __1__ sessions per day.   Forward    Facing step, place one leg on step, flexed at hip. Step up slowly, bringing hips in line with knee and shoulder. Bring other foot onto step. Reverse process to step back down. Repeat with other leg. Do __10__ repetitions, _2-3___ sets.

## 2016-09-24 NOTE — Therapy (Signed)
[name], [place], [address] Phone: [phone]   Fax:  [phone]  Physical Therapy Treatment  Patient Details  Name: [name] MRN: [id number] Date of Birth: August 27, 1945 Referring Provider: Dr Corky Mull   Encounter Date: 09/24/2016      PT End of Session - 09/24/16 1447    Visit Number 6   Number of Visits 18   Date for PT Re-Evaluation 11/05/16   Authorization Type [employer] Medicare - G code at 10 th visit    PT Start Time 1447   PT Stop Time 1542   PT Time Calculation (min) 55 min   Activity Tolerance Patient tolerated treatment well      Past Medical History:  Diagnosis Date  . Arthritis   . Blood transfusion    for GI bleed 1987  . Diabetes mellitus   . Hypertension   . Thyroid disease     Past Surgical History:  Procedure Laterality Date  . rt knee arthroscopy    . THYROIDECTOMY, PARTIAL      There were no vitals filed for this visit.      Subjective Assessment - 09/24/16 1451    Subjective [name] reports that she saw the PA at orthopedic office this week. PA was pleased with progress. [name] is now allowed to drive. She drove today and did fine. Sutures were removed and pt reports that the knee feels better without the sutures. She slept about 12 hours last night - slept so well!    Currently in Pain? No/denies                         [facility] Adult PT Treatment/Exercise - 09/24/16 0001      Knee/Hip Exercises: Stretches   Gastroc Stretch 3 reps;20 seconds     Knee/Hip Exercises: Aerobic   Nustep L5 x 6 min      Knee/Hip Exercises: Standing   Hip Abduction Stengthening;Right;Left;10 reps;2 sets  leading out with heel green TB   Hip Extension Stengthening;Right;Left;10 reps;2 sets  green TB   Forward Step Up Right;10 reps;2 sets;Hand Hold: 2;Step Height: 4"     Knee/Hip Exercises: Seated   Other Seated Knee/Hip Exercises knee flexion scooting  forward at table 20 sec x 5    Sit to Sand 10 reps;without UE support  ~18 inche surface      Knee/Hip Exercises: Supine   Heel Slides Right;10 reps  with strap     Moist Heat Therapy   Number Minutes Moist Heat 15 Minutes   Moist Heat Location Hip  anterior Rt hip/chest - to tolerate cold for knee better      Vasopneumatic   Number Minutes Vasopneumatic  15 minutes   Vasopnuematic Location  Knee  Rt   Vasopneumatic Pressure Low   Vasopneumatic Temperature  34 deg                PT Education - 09/24/16 1516    Education provided Yes   Education Details HEP   Person(s) Educated Patient   Methods Explanation;Demonstration;Tactile cues;Verbal cues;Handout   Comprehension Verbalized understanding;Returned demonstration;Verbal cues required;Tactile cues required          PT Short Term Goals - 09/15/16 1257      PT SHORT TERM GOAL #1   Title independent with initial HEP (10/08/16)   Time 4   Period Weeks   Status On-going     PT SHORT  TERM GOAL #2   Title Increase AROM Rt knee 0-120 degrees 10/08/16   Time 4   Period Weeks   Status On-going     PT SHORT TERM GOAL #3   Title 4/5 to 4+/5 strength Rt LE 10/08/16   Time 4   Period Weeks   Status On-going     PT SHORT TERM GOAL #4   Title Ambulate with single point cane for 200-300 ft with good gait pattern 4/19;18   Time 4   Period Weeks   Status On-going           PT Long Term Goals - 09/15/16 1258      PT LONG TERM GOAL #1   Title Ambulate without assistive device with good gait pattern 11/05/16   Time 8   Period Weeks   Status On-going     PT LONG TERM GOAL #2   Title 4+/5 to 5/5 strength Rt LE 11/05/16   Time 8   Period Weeks   Status On-going     PT LONG TERM GOAL #3   Title AROM Rt knee 0-130 degrees 11/05/16   Time 8   Period Weeks   Status On-going     PT LONG TERM GOAL #4   Title Independent in HEP 11/05/16   Time 8   Period Weeks   Status On-going     PT LONG TERM GOAL #5    Title Improve FOTO to </= 45% limitation  11/05/16   Time 8   Period Weeks   Status On-going               Plan - 09/24/16 1454    Clinical Impression Statement Progressing well with exercise. Continues to benefit from PT to reach maximum rehab potential and return to full independent lifestyle.    Rehab Potential Good   PT Frequency 2x / week   PT Treatment/Interventions ADLs/Self Care Home Management;Cryotherapy;Electrical Stimulation;Moist Heat;Therapeutic exercise;Therapeutic activities;Functional mobility training;Stair training;Gait training;Patient/family education;Manual techniques;Scar mobilization;Passive range of motion;Vasopneumatic Device;Taping;Dry needling   PT Next Visit Plan Continue to review consolidated/simplified HEP as needed. Progressive Rt knee ROM/strengthening/gait. Progress HEP as indicated. Address tightness through hip flexors as indicated    Consulted and Agree with Plan of Care Patient      Patient will benefit from skilled therapeutic intervention in order to improve the following deficits and impairments:  Abnormal gait, Decreased mobility, Decreased range of motion, Decreased strength, Difficulty walking, Pain  Visit Diagnosis: Acute pain of right knee  Other abnormalities of gait and mobility  Stiffness of right knee, not elsewhere classified     Problem List Patient Active Problem List   Diagnosis Date Noted  . Hyperglycemia 04/24/2014  . Hypokalemia 09/02/2013  . Routine general medical examination at a health care facility 09/01/2013  . Low back pain 08/31/2012  . Encounter for long-term (current) use of other medications 08/13/2011  . 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 Numbness 08/13/2011  . HYPERCHOLESTEROLEMIA 08/11/2010  . Asymptomatic postmenopausal status 08/03/2008  . Anemia 08/03/2007  . NONSPECIFIC ABNORMAL ELECTROCARDIOGRAM 08/03/2007  . HYPERTENSION 02/19/2007  . ALLERGIC RHINITIS 02/19/2007  . OSTEOARTHRITIS  02/19/2007    [name] Nilda Simmer PT, MPH 09/24/2016, 3:28 PM  [facility] [place], [place], [address] Phone: [phone]   Fax:  [phone]  Name: [name] MRN: [id number] Date of Birth: July 26, 1945

## 2016-09-28 ENCOUNTER — Ambulatory Visit (INDEPENDENT_AMBULATORY_CARE_PROVIDER_SITE_OTHER): Payer: Medicare Other | Admitting: Physical Therapy

## 2016-09-28 DIAGNOSIS — M25561 Pain in right knee: Secondary | ICD-10-CM | POA: Diagnosis not present

## 2016-09-28 DIAGNOSIS — R2689 Other abnormalities of gait and mobility: Secondary | ICD-10-CM | POA: Diagnosis not present

## 2016-09-28 DIAGNOSIS — M25661 Stiffness of right knee, not elsewhere classified: Secondary | ICD-10-CM | POA: Diagnosis not present

## 2016-09-28 NOTE — Therapy (Signed)
[name] [place], [place], [address] Phone: [phone]   Fax:  [phone]  Physical Therapy Treatment  Patient Details  Name: [name] MRN: [id number] Date of Birth: [birth year] Referring Provider: Dr. Corky Mull  Encounter Date: 09/28/2016      PT End of Session - 09/28/16 1443    Visit Number 7   Number of Visits 18   Date for PT Re-Evaluation 11/05/16   Authorization Type [employer] Medicare - G code at 10 th visit    PT Start Time 1436  pt arrived late   PT Stop Time 1520   PT Time Calculation (min) 44 min      Past Medical History:  Diagnosis Date  . Arthritis   . Blood transfusion    for GI bleed 1987  . Diabetes mellitus   . Hypertension   . Thyroid disease     Past Surgical History:  Procedure Laterality Date  . rt knee arthroscopy    . THYROIDECTOMY, PARTIAL      There were no vitals filed for this visit.      Subjective Assessment - 09/28/16 1443    Subjective Pt reports she has been using heating pad and massaging Rt hip with ball.     Pertinent History s/p microdiscketomy (4 yrs ago)- "I still have back problems"   Patient Stated Goals improve function and strength   Currently in Pain? No/denies   Pain Score 0-No pain            [facility] PT Assessment - 09/28/16 0001      Assessment   Medical Diagnosis Rt UKA    Referring Provider Dr. Corky Mull   Onset Date/Surgical Date 09/08/16   Hand Dominance Right   Next MD Visit 10/22/16     AROM   Right Knee Extension -2   Right Knee Flexion 125         [facility] Adult PT Treatment/Exercise - 09/28/16 0001      Self-Care   Self-Care Other Self-Care Comments   Other Self-Care Comments  Pt educated on scar massage, patellar mobilizations.  Pt verbalized understanding and returned demo of patellar mobs.      Knee/Hip Exercises: Stretches   Passive Hamstring Stretch Right;2 reps;30 seconds   Piriformis Stretch Right;1  rep;60 seconds   Gastroc Stretch Right;1 rep;30 seconds   Other Knee/Hip Stretches pt visually educated on Quad stretch in prone and sidelying; will try next visit.      Knee/Hip Exercises: Aerobic   Nustep L4 x 6 min, 55-60 SPM     Knee/Hip Exercises: Standing   SLS Rt/Lt SLS - mulitple trials up to 4 sec each leg.    Gait Training 80 ft with VC for equal stance time, increased Rt hip extension.  Improved quality with cues and increased distance. (no assistive device, supervision only)   Other Standing Knee Exercises tandem stance x 30 sec x 2 reps each leg forward.      Knee/Hip Exercises: Seated   Other Seated Knee/Hip Exercises seated forward scoots x 10, 10 sec hold.    Sit to Sand 1 set;with UE support;10 reps  low black mat, eccentric control.      Knee/Hip Exercises: Supine   Quad Sets Right;1 set;5 reps  for ext measurement   Heel Slides AROM;Right;5 reps     Vasopneumatic   Number Minutes Vasopneumatic  --  pt declined; will ice at home.  Manual Therapy   Soft tissue mobilization working through Rt hip flexors proximal quad                   PT Short Term Goals - 09/15/16 1257      PT SHORT TERM GOAL #1   Title independent with initial HEP (10/08/16)   Time 4   Period Weeks   Status On-going     PT SHORT TERM GOAL #2   Title Increase AROM Rt knee 0-120 degrees 10/08/16   Time 4   Period Weeks   Status On-going     PT SHORT TERM GOAL #3   Title 4/5 to 4+/5 strength Rt LE 10/08/16   Time 4   Period Weeks   Status On-going     PT SHORT TERM GOAL #4   Title Ambulate with single point cane for 200-300 ft with good gait pattern 4/19;18   Time 4   Period Weeks   Status On-going           PT Long Term Goals - 09/28/16 1701      PT LONG TERM GOAL #1   Title Ambulate without assistive device with good gait pattern 11/05/16   Time 8   Period Weeks   Status Partially Met     PT LONG TERM GOAL #2   Title 4+/5 to 5/5 strength Rt LE 11/05/16    Time 8   Period Weeks   Status On-going     PT LONG TERM GOAL #3   Title AROM Rt knee 0-130 degrees 11/05/16   Period Weeks   Status On-going  progressing     PT LONG TERM GOAL #4   Title Independent in HEP 11/05/16   Time 8   Period Weeks   Status On-going     PT LONG TERM GOAL #5   Title Improve FOTO to </= 45% limitation  11/05/16   Time 8   Period Weeks   Status On-going               Plan - 09/28/16 1702    Clinical Impression Statement Pt demonstrated improved Rt knee flexion ROM.  Pt reported increased pain with sit to/from stand, but able to complete from lower surface with less UE assist.  Progressing towards goals.    Rehab Potential Good   PT Frequency 2x / week   PT Duration 8 weeks   PT Treatment/Interventions ADLs/Self Care Home Management;Cryotherapy;Electrical Stimulation;Moist Heat;Therapeutic exercise;Therapeutic activities;Functional mobility training;Stair training;Gait training;Patient/family education;Manual techniques;Scar mobilization;Passive range of motion;Vasopneumatic Device;Taping;Dry needling   PT Next Visit Plan Continue progressive Rt knee ROM/strengthening/gait. Progress HEP as indicated. Address tightness through hip flexors as indicated    Consulted and Agree with Plan of Care Patient      Patient will benefit from skilled therapeutic intervention in order to improve the following deficits and impairments:  Abnormal gait, Decreased mobility, Decreased range of motion, Decreased strength, Difficulty walking, Pain  Visit Diagnosis: Acute pain of right knee  Other abnormalities of gait and mobility  Stiffness of right knee, not elsewhere classified     Problem List Patient Active Problem List   Diagnosis Date Noted  . Hyperglycemia 04/24/2014  . Hypokalemia 09/02/2013  . Routine general medical examination at a health care facility 09/01/2013  . Low back pain 08/31/2012  . Encounter for long-term (current) use of other  medications 08/13/2011  . 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 Numbness 08/13/2011  . HYPERCHOLESTEROLEMIA 08/11/2010  . Asymptomatic postmenopausal status  08/03/2008  . Anemia 08/03/2007  . NONSPECIFIC ABNORMAL ELECTROCARDIOGRAM 08/03/2007  . HYPERTENSION 02/19/2007  . ALLERGIC RHINITIS 02/19/2007  . OSTEOARTHRITIS 02/19/2007   [name], PTA 09/28/16 5:04 PM  [facility] [place], [place], [address] Phone: [phone]   Fax:  [phone]  Name: SAVANA SPINA MRN: [id number] Date of Birth: 1945-11-30

## 2016-10-01 ENCOUNTER — Ambulatory Visit (INDEPENDENT_AMBULATORY_CARE_PROVIDER_SITE_OTHER): Payer: Medicare Other | Admitting: Physical Therapy

## 2016-10-01 DIAGNOSIS — M25561 Pain in right knee: Secondary | ICD-10-CM

## 2016-10-01 DIAGNOSIS — M25661 Stiffness of right knee, not elsewhere classified: Secondary | ICD-10-CM | POA: Diagnosis not present

## 2016-10-01 DIAGNOSIS — R2689 Other abnormalities of gait and mobility: Secondary | ICD-10-CM

## 2016-10-01 NOTE — Therapy (Signed)
[place], [place], [address] Phone: [phone]   Fax:  [phone]  Physical Therapy Treatment  Patient Details  Name: [name] MRN: [id number] Date of Birth: [birth year] Referring Provider: Dr. Corky Mull  Encounter Date: 10/01/2016      PT End of Session - 10/01/16 1455    Visit Number 8   Number of Visits 18   Date for PT Re-Evaluation 11/05/16   PT Start Time 2233   PT Stop Time 1555   PT Time Calculation (min) 62 min   Behavior During Therapy [facility] for tasks assessed/performed      Past Medical History:  Diagnosis Date  . Arthritis   . Blood transfusion    for GI bleed 1987  . Diabetes mellitus   . Hypertension   . Thyroid disease     Past Surgical History:  Procedure Laterality Date  . rt knee arthroscopy    . THYROIDECTOMY, PARTIAL      There were no vitals filed for this visit.      Subjective Assessment - 10/01/16 1456    Subjective Pt arrives with SPC.  She stopped using RW after last visit.  Now using SPC as needed (middle of night to use restroom, longer distances in parking lots).     Currently in Pain? Yes   Pain Score 1    Pain Location Knee   Pain Orientation Right   Pain Descriptors / Indicators Aching   Aggravating Factors  moving knee in (valgus stress)   Pain Relieving Factors ice            [facility] PT Assessment - 10/01/16 0001      Assessment   Medical Diagnosis Rt UKA    Referring Provider Dr. Corky Mull   Onset Date/Surgical Date 09/08/16   Hand Dominance Right   Next MD Visit 10/22/16           [facility] Adult PT Treatment/Exercise - 10/01/16 0001      Self-Care   Other Self-Care Comments  Pt re-educated on scar massage, patellar mobilizations.  Pt verbalized understanding and returned demo of patellar mobs.      Knee/Hip Exercises: Stretches   Passive Hamstring Stretch Right;3 reps;30 seconds   Quad Stretch Right;30 seconds;4  reps  sidelying trial, then standing   Quad Stretch Limitations painful medial Lt knee in sidelying, improved stretch and comfort with foot on chair seat in standing.    Piriformis Stretch Right;1 rep;60 seconds   Gastroc Stretch Right;Left;2 reps;30 seconds     Knee/Hip Exercises: Aerobic   Nustep L4 x 6 min, 65 SPM     Knee/Hip Exercises: Standing   Lateral Step Up Right;1 set;10 reps  3", BUE assist   Forward Step Up Right;1 set;Hand Hold: 1  3"   SLS Rt/Lt SLS x 2 trials, up to 25 sec Rt, 10 sec Lt.    Other Standing Knee Exercises tandem stance x 30 sec x 2 reps each leg forward.      Knee/Hip Exercises: Seated   Sit to Sand 1 set;with UE support;5 reps  black mat with blue pad; eccentric control.      Modalities   Modalities Associate Professor Action IFC   Electrical Stimulation Parameters to pt tolerance    Electrical Stimulation Goals Pain     Vasopneumatic   Number Minutes Vasopneumatic  15 minutes   Vasopnuematic Location  Knee   Vasopneumatic Pressure Low   Vasopneumatic Temperature  34 deg                  PT Short Term Goals - 10/01/16 1551      PT SHORT TERM GOAL #1   Title independent with initial HEP (10/08/16)   Time 4   Period Weeks   Status Achieved     PT SHORT TERM GOAL #2   Title Increase AROM Rt knee 0-120 degrees 10/08/16   Time 4   Period Weeks   Status Partially Met     PT SHORT TERM GOAL #3   Title 4/5 to 4+/5 strength Rt LE 10/08/16   Time 4   Period Weeks   Status On-going     PT SHORT TERM GOAL #4   Title Ambulate with single point cane for 200-300 ft with good gait pattern 4/19;18   Time 4   Period Weeks   Status Achieved           PT Long Term Goals - 10/01/16 1552      PT LONG TERM GOAL #1   Title Ambulate without assistive device with good gait pattern 11/05/16   Time 8   Period Weeks    Status Achieved     PT LONG TERM GOAL #2   Title 4+/5 to 5/5 strength Rt LE 11/05/16   Time 8   Period Weeks   Status On-going     PT LONG TERM GOAL #3   Title AROM Rt knee 0-130 degrees 11/05/16   Time 8   Period Weeks   Status On-going     PT LONG TERM GOAL #4   Title Independent in HEP 11/05/16   Time 8   Period Weeks   Status On-going     PT LONG TERM GOAL #5   Title Improve FOTO to </= 45% limitation  11/05/16   Time 8   Period Weeks   Status On-going               Plan - 10/01/16 1542    Clinical Impression Statement [name]'s SLS time on Rt has improved from 4 seconds to 24 seconds.  She was able to tolerate standing quad stretch with foot supported on chair.  She reported increase in Rt knee pain, up to 6-7/10 with exercises today.  Pain reduced with rest and use of vaso at end of session. Progressing towards goals- has met STG #1, 4. LTG #1, and partially met STG 2.     Rehab Potential Good   PT Frequency 2x / week   PT Duration 8 weeks   PT Treatment/Interventions ADLs/Self Care Home Management;Cryotherapy;Electrical Stimulation;Moist Heat;Therapeutic exercise;Therapeutic activities;Functional mobility training;Stair training;Gait training;Patient/family education;Manual techniques;Scar mobilization;Passive range of motion;Vasopneumatic Device;Taping;Dry needling   PT Next Visit Plan Continue progressive Rt knee ROM/strengthening/gait. Progress HEP as indicated.    Consulted and Agree with Plan of Care Patient      Patient will benefit from skilled therapeutic intervention in order to improve the following deficits and impairments:  Abnormal gait, Decreased mobility, Decreased range of motion, Decreased strength, Difficulty walking, Pain  Visit Diagnosis: Acute pain of right knee  Other abnormalities of gait and mobility  Stiffness of right knee, not elsewhere classified     Problem List Patient Active Problem List   Diagnosis Date Noted  .  Hyperglycemia 04/24/2014  . Hypokalemia 09/02/2013  . Routine general medical examination at a  health care facility 09/01/2013  . Low back pain 08/31/2012  . Encounter for long-term (current) use of other medications 08/13/2011  . 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 Numbness 08/13/2011  . HYPERCHOLESTEROLEMIA 08/11/2010  . Asymptomatic postmenopausal status 08/03/2008  . Anemia 08/03/2007  . NONSPECIFIC ABNORMAL ELECTROCARDIOGRAM 08/03/2007  . HYPERTENSION 02/19/2007  . ALLERGIC RHINITIS 02/19/2007  . OSTEOARTHRITIS 02/19/2007   Kerin Perna, PTA 10/01/16 4:06 PM  [facility] [place], [place], [address] Phone: [phone]   Fax:  [phone]  Name: [name] MRN: [id number] Date of Birth: Jul 02, 1945

## 2016-10-05 ENCOUNTER — Ambulatory Visit (INDEPENDENT_AMBULATORY_CARE_PROVIDER_SITE_OTHER): Payer: Medicare Other | Admitting: Rehabilitative and Restorative Service Providers"

## 2016-10-05 ENCOUNTER — Encounter: Payer: Self-pay | Admitting: Rehabilitative and Restorative Service Providers"

## 2016-10-05 DIAGNOSIS — M25561 Pain in right knee: Secondary | ICD-10-CM

## 2016-10-05 DIAGNOSIS — R2689 Other abnormalities of gait and mobility: Secondary | ICD-10-CM | POA: Diagnosis not present

## 2016-10-05 DIAGNOSIS — M25661 Stiffness of right knee, not elsewhere classified: Secondary | ICD-10-CM

## 2016-10-05 NOTE — Therapy (Signed)
[place], [place], [address] Phone: [phone]   Fax:  [phone]  Physical Therapy Treatment  Patient Details  Name: [name] MRN: [id number] Date of Birth: 21-Oct-1945 Referring Provider: Dr. Corky Mull  Encounter Date: 10/05/2016      PT End of Session - 10/05/16 1652    Visit Number 9   Number of Visits 18   Date for PT Re-Evaluation 11/05/16   Authorization Type [employer] Medicare - G code at 10 th visit    PT Start Time 1401   PT Stop Time 1458   PT Time Calculation (min) 57 min   Activity Tolerance Patient tolerated treatment well      Past Medical History:  Diagnosis Date  . Arthritis   . Blood transfusion    for GI bleed 1987  . Diabetes mellitus   . Hypertension   . Thyroid disease     Past Surgical History:  Procedure Laterality Date  . rt knee arthroscopy    . THYROIDECTOMY, PARTIAL      There were no vitals filed for this visit.      Subjective Assessment - 10/05/16 1607    Subjective Patient reports that the Rt quad and anterior hip are very tight still. That's the thing that bothers her the most.    Currently in Pain? Yes   Pain Score 1    Pain Location Knee   Pain Orientation Right   Pain Descriptors / Indicators Aching;Tightness   Pain Type Surgical pain   Pain Onset More than a month ago   Pain Frequency Intermittent                         [facility] Adult PT Treatment/Exercise - 10/05/16 0001      Knee/Hip Exercises: Stretches   Sports administrator Right;30 seconds;4 reps  standing   Quad Stretch Limitations PT assisted stretch in supine hip extension knee flexed off edge of table - 30 sec x 3    Other Knee/Hip Stretches hip adductor stretch 30 sec x 3 PT assit with patient in supine      Knee/Hip Exercises: Aerobic   Nustep L4 x 6 min, 65 SPM     Moist Heat Therapy   Number Minutes Moist Heat 15 Minutes   Moist Heat Location Knee;Hip   quads/hip flexors      Electrical Stimulation   Electrical Stimulation Location Rt knee; Rt quad    Electrical Stimulation Action IFC   Electrical Stimulation Parameters to tolerance   Electrical Stimulation Goals Pain;Tone     Manual Therapy   Manual Therapy --  scar massage    Manual therapy comments patient supine    Joint Mobilization patellar mobs lateral/medial/distal    Soft tissue mobilization working through Rt hip flexors and quads    Myofascial Release proximal anterior hip into quad                 PT Education - 10/05/16 1658    Education provided Yes   Education Details HEP    Person(s) Educated Patient   Methods Explanation   Comprehension Verbalized understanding          PT Short Term Goals - 10/05/16 1657      PT SHORT TERM GOAL #1   Title independent with initial HEP (10/08/16)   Time 4   Period Weeks   Status Achieved     PT SHORT  TERM GOAL #2   Title Increase AROM Rt knee 0-120 degrees 10/08/16   Time 4   Period Weeks   Status Partially Met     PT SHORT TERM GOAL #3   Title 4/5 to 4+/5 strength Rt LE 10/08/16   Time 4   Period Weeks   Status On-going     PT SHORT TERM GOAL #4   Title Ambulate with single point cane for 200-300 ft with good gait pattern 4/19;18   Time 4   Period Weeks   Status Achieved           PT Long Term Goals - 10/05/16 1651      PT LONG TERM GOAL #1   Title Ambulate without assistive device with good gait pattern 11/05/16   Time 8   Period Weeks   Status Achieved     PT LONG TERM GOAL #2   Title 4+/5 to 5/5 strength Rt LE 11/05/16   Time 8   Period Weeks   Status On-going     PT LONG TERM GOAL #3   Title AROM Rt knee 0-130 degrees 11/05/16   Time 8   Period Weeks   Status On-going     PT LONG TERM GOAL #4   Title Independent in HEP 11/05/16   Time 8   Period Weeks   Status On-going     PT LONG TERM GOAL #5   Title Improve FOTO to </= 45% limitation  11/05/16   Time 8   Period Weeks    Status On-going               Plan - 10/05/16 1653    Clinical Impression Statement Focus today on releasing tightness through Rt quads and hip flexors with modalities, manual work, stretching, modalities. Noted improved tissue extensibility following treatment with less palpable tightness. Patient reports improvment in the way the Rt LE feels - looser and less uncomfortable.  Discussed DN - will wait until she returns to MD and also assess response to today's treatment.    Rehab Potential Good   PT Frequency 2x / week   PT Duration 8 weeks   PT Treatment/Interventions ADLs/Self Care Home Management;Cryotherapy;Electrical Stimulation;Moist Heat;Therapeutic exercise;Therapeutic activities;Functional mobility training;Stair training;Gait training;Patient/family education;Manual techniques;Scar mobilization;Passive range of motion;Vasopneumatic Device;Taping;Dry needling   PT Next Visit Plan Continue progressive Rt knee ROM/strengthening/gait. Assess response to treatment with focus on the quad tightness. Progress HEP as indicated.    Consulted and Agree with Plan of Care Patient      Patient will benefit from skilled therapeutic intervention in order to improve the following deficits and impairments:  Abnormal gait, Decreased mobility, Decreased range of motion, Decreased strength, Difficulty walking, Pain  Visit Diagnosis: Acute pain of right knee  Other abnormalities of gait and mobility  Stiffness of right knee, not elsewhere classified     Problem List Patient Active Problem List   Diagnosis Date Noted  . Hyperglycemia 04/24/2014  . Hypokalemia 09/02/2013  . Routine general medical examination at a health care facility 09/01/2013  . Low back pain 08/31/2012  . Encounter for long-term (current) use of other medications 08/13/2011  . 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 Numbness 08/13/2011  . HYPERCHOLESTEROLEMIA 08/11/2010  . Asymptomatic postmenopausal status  08/03/2008  . Anemia 08/03/2007  . NONSPECIFIC ABNORMAL ELECTROCARDIOGRAM 08/03/2007  . HYPERTENSION 02/19/2007  . ALLERGIC RHINITIS 02/19/2007  . OSTEOARTHRITIS 02/19/2007    Athalie Newhard Nilda Simmer PT, MPH 10/05/2016, 5:06 PM  [facility]  [place], [place], [address] Phone: [phone]   Fax:  [phone]  Name: YANETTE TRIPOLI MRN: [id number] Date of Birth: [birth year]

## 2016-10-05 NOTE — Patient Instructions (Signed)

## 2016-10-06 ENCOUNTER — Encounter: Payer: Self-pay | Admitting: Rehabilitative and Restorative Service Providers"

## 2016-10-09 ENCOUNTER — Encounter: Payer: Self-pay | Admitting: Rehabilitative and Restorative Service Providers"

## 2016-10-09 ENCOUNTER — Ambulatory Visit (INDEPENDENT_AMBULATORY_CARE_PROVIDER_SITE_OTHER): Payer: Medicare Other | Admitting: Rehabilitative and Restorative Service Providers"

## 2016-10-09 DIAGNOSIS — M25661 Stiffness of right knee, not elsewhere classified: Secondary | ICD-10-CM

## 2016-10-09 DIAGNOSIS — R2689 Other abnormalities of gait and mobility: Secondary | ICD-10-CM | POA: Diagnosis not present

## 2016-10-09 DIAGNOSIS — M25561 Pain in right knee: Secondary | ICD-10-CM

## 2016-10-09 NOTE — Therapy (Addendum)
[place], [place], [address] Phone: [phone]   Fax:  [phone]  Physical Therapy Treatment  Patient Details  Name: [name] MRN: [id number] Date of Birth: 08-20-1945 Referring Provider: Dr Corky Mull  Encounter Date: 10/09/2016      PT End of Session - 10/09/16 1450    Visit Number 10   Number of Visits 18   Date for PT Re-Evaluation 11/05/16   Authorization Type [facility] [employer] G code 20th visit   PT Start Time 1450   PT Stop Time 1547   PT Time Calculation (min) 57 min   Activity Tolerance Patient tolerated treatment well      Past Medical History:  Diagnosis Date  . Arthritis   . Blood transfusion    for GI bleed 1987  . Diabetes mellitus   . Hypertension   . Thyroid disease     Past Surgical History:  Procedure Laterality Date  . rt knee arthroscopy    . THYROIDECTOMY, PARTIAL      There were no vitals filed for this visit.      Subjective Assessment - 10/09/16 1453    Subjective Stretch we did last visit did not bother her when we did it but made her back sore that night and the next day. Does not want to do that again. (hip flexor stretch Rt LE off edge of table)    Currently in Pain? Yes   Pain Score 1    Pain Location Knee   Pain Orientation Right   Pain Type Chronic pain   Pain Radiating Towards pain in the Rt hip sometimes at night - when she pushs on it   Pain Onset More than a month ago   Pain Frequency Intermittent            [facility] PT Assessment - 10/09/16 0001      Assessment   Medical Diagnosis Rt UKA    Referring Provider Dr Corky Mull   Onset Date/Surgical Date 09/08/16   Hand Dominance Right   Next MD Visit 10/22/16     Observation/Other Assessments   Focus on Therapeutic Outcomes (FOTO)  55% limitation      AROM   Right Knee Extension -2   Right Knee Flexion 131     Strength   Right Hip Flexion 4/5   Right Hip Extension 3+/5    Right Hip ABduction 4-/5   Right Hip ADduction 5/5   Left Hip Flexion 4/5   Left Hip Extension 3+/5   Left Hip ABduction 4+/5   Right Knee Flexion 4/5   Right Knee Extension 4/5   Left Knee Flexion 5/5   Left Knee Extension 5/5     Palpation   Patella mobility decreased patellar mobility                      [facility] Adult PT Treatment/Exercise - 10/09/16 0001      Knee/Hip Exercises: Stretches   Passive Hamstring Stretch Right;3 reps;30 seconds   Other Knee/Hip Stretches hip flexor stretch sitting sideways on chair dropping Rt knee toward floor extensing hip 30-45 sec x 2 reps     Knee/Hip Exercises: Aerobic   Nustep L4 x 6 min, 65 SPM     Knee/Hip Exercises: Standing   Heel Raises Both;20 reps   Forward Step Up Right;2 sets;10 reps;Hand Hold: 2;Step Height: 4"   SLS Rt/Lt SLS x 2 each LE  Other Standing Knee Exercises terminal knee ext again 5 in ball 5 sec hold; TKE with blue TB x 20      Knee/Hip Exercises: Supine   Quad Sets Strengthening;Right;10 reps  10 sec hold      Moist Heat Therapy   Number Minutes Moist Heat 15 Minutes   Moist Heat Location Knee;Hip  quads/hip flexors      Vasopneumatic   Number Minutes Vasopneumatic  15 minutes   Vasopnuematic Location  Knee   Vasopneumatic Pressure Low   Vasopneumatic Temperature  34 deg     Manual Therapy   Manual Therapy --  scar massage                   PT Short Term Goals - 10/05/16 1657      PT SHORT TERM GOAL #1   Title independent with initial HEP (10/08/16)   Time 4   Period Weeks   Status Achieved     PT SHORT TERM GOAL #2   Title Increase AROM Rt knee 0-120 degrees 10/08/16   Time 4   Period Weeks   Status Partially Met     PT SHORT TERM GOAL #3   Title 4/5 to 4+/5 strength Rt LE 10/08/16   Time 4   Period Weeks   Status On-going     PT SHORT TERM GOAL #4   Title Ambulate with single point cane for 200-300 ft with good gait pattern 4/19;18   Time 4   Period Weeks    Status Achieved           PT Long Term Goals - 10/05/16 1651      PT LONG TERM GOAL #1   Title Ambulate without assistive device with good gait pattern 11/05/16   Time 8   Period Weeks   Status Achieved     PT LONG TERM GOAL #2   Title 4+/5 to 5/5 strength Rt LE 11/05/16   Time 8   Period Weeks   Status On-going     PT LONG TERM GOAL #3   Title AROM Rt knee 0-130 degrees 11/05/16   Time 8   Period Weeks   Status On-going     PT LONG TERM GOAL #4   Title Independent in HEP 11/05/16   Time 8   Period Weeks   Status On-going     PT LONG TERM GOAL #5   Title Improve FOTO to </= 45% limitation  11/05/16   Time 8   Period Weeks   Status On-going               Plan - 10/09/16 1534    Clinical Impression Statement Continued hip flexor tightness but less than previous visits. She tolerated the sitting hip flexor stretch. ROM and strength are increasing. patient is now ambulating without assistive device for shoudler distances in the home and with straight cane out of the house. Progressing well toward stated goals of therapy. Will benefit from continued PT to progress to maximum benefit.    Rehab Potential Good   PT Frequency 2x / week   PT Duration 8 weeks   PT Treatment/Interventions ADLs/Self Care Home Management;Cryotherapy;Electrical Stimulation;Moist Heat;Therapeutic exercise;Therapeutic activities;Functional mobility training;Stair training;Gait training;Patient/family education;Manual techniques;Scar mobilization;Passive range of motion;Vasopneumatic Device;Taping;Dry needling   PT Next Visit Plan Continue progressive Rt knee ROM/strengthening/gait. Assess response to treatment with focus on the quad tightness. Progress HEP as indicated.    Consulted and Agree with Plan of Care Patient  Patient will benefit from skilled therapeutic intervention in order to improve the following deficits and impairments:  Abnormal gait, Decreased mobility, Decreased range of  motion, Decreased strength, Difficulty walking, Pain  Visit Diagnosis: Acute pain of right knee  Other abnormalities of gait and mobility  Stiffness of right knee, not elsewhere classified     Problem List Patient Active Problem List   Diagnosis Date Noted  . Hyperglycemia 04/24/2014  . Hypokalemia 09/02/2013  . Routine general medical examination at a health care facility 09/01/2013  . Low back pain 08/31/2012  . Encounter for long-term (current) use of other medications 08/13/2011  . 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 Numbness 08/13/2011  . HYPERCHOLESTEROLEMIA 08/11/2010  . Asymptomatic postmenopausal status 08/03/2008  . Anemia 08/03/2007  . NONSPECIFIC ABNORMAL ELECTROCARDIOGRAM 08/03/2007  . HYPERTENSION 02/19/2007  . ALLERGIC RHINITIS 02/19/2007  . OSTEOARTHRITIS 02/19/2007    [name] PT, MPH  10/09/2016, 4:25 PM  [facility] [place], [place], [address] Phone: [phone]   Fax:  [phone]  Name: [name] MRN: [id number] Date of Birth: 05-Sep-1945

## 2016-10-09 NOTE — Therapy (Signed)
[place], [place], [address] Phone: [phone]   Fax:  [phone]  Physical Therapy Treatment  Patient Details  Name: [name] MRN: [id number] Date of Birth: 08-20-1945 Referring Provider: Dr Corky Mull  Encounter Date: 10/09/2016      PT End of Session - 10/09/16 1450    Visit Number 10   Number of Visits 18   Date for PT Re-Evaluation 11/05/16   Authorization Type [facility] [employer] G code 20th visit   PT Start Time 1450   PT Stop Time 1547   PT Time Calculation (min) 57 min   Activity Tolerance Patient tolerated treatment well      Past Medical History:  Diagnosis Date  . Arthritis   . Blood transfusion    for GI bleed 1987  . Diabetes mellitus   . Hypertension   . Thyroid disease     Past Surgical History:  Procedure Laterality Date  . rt knee arthroscopy    . THYROIDECTOMY, PARTIAL      There were no vitals filed for this visit.      Subjective Assessment - 10/09/16 1453    Subjective Stretch we did last visit did not bother her when we did it but made her back sore that night and the next day. Does not want to do that again. (hip flexor stretch Rt LE off edge of table)    Currently in Pain? Yes   Pain Score 1    Pain Location Knee   Pain Orientation Right   Pain Type Chronic pain   Pain Radiating Towards pain in the Rt hip sometimes at night - when she pushs on it   Pain Onset More than a month ago   Pain Frequency Intermittent            [facility] PT Assessment - 10/09/16 0001      Assessment   Medical Diagnosis Rt UKA    Referring Provider Dr Corky Mull   Onset Date/Surgical Date 09/08/16   Hand Dominance Right   Next MD Visit 10/22/16     Observation/Other Assessments   Focus on Therapeutic Outcomes (FOTO)  55% limitation      AROM   Right Knee Extension -2   Right Knee Flexion 131     Strength   Right Hip Flexion 4/5   Right Hip Extension 3+/5    Right Hip ABduction 4-/5   Right Hip ADduction 5/5   Left Hip Flexion 4/5   Left Hip Extension 3+/5   Left Hip ABduction 4+/5   Right Knee Flexion 4/5   Right Knee Extension 4/5   Left Knee Flexion 5/5   Left Knee Extension 5/5     Palpation   Patella mobility decreased patellar mobility                      [facility] Adult PT Treatment/Exercise - 10/09/16 0001      Knee/Hip Exercises: Stretches   Passive Hamstring Stretch Right;3 reps;30 seconds   Other Knee/Hip Stretches hip flexor stretch sitting sideways on chair dropping Rt knee toward floor extensing hip 30-45 sec x 2 reps     Knee/Hip Exercises: Aerobic   Nustep L4 x 6 min, 65 SPM     Knee/Hip Exercises: Standing   Heel Raises Both;20 reps   Forward Step Up Right;2 sets;10 reps;Hand Hold: 2;Step Height: 4"   SLS Rt/Lt SLS x 2 each LE  Other Standing Knee Exercises terminal knee ext again 5 in ball 5 sec hold; TKE with blue TB x 20      Knee/Hip Exercises: Supine   Quad Sets Strengthening;Right;10 reps  10 sec hold      Moist Heat Therapy   Number Minutes Moist Heat 15 Minutes   Moist Heat Location Knee;Hip  quads/hip flexors      Vasopneumatic   Number Minutes Vasopneumatic  15 minutes   Vasopnuematic Location  Knee   Vasopneumatic Pressure Low   Vasopneumatic Temperature  34 deg     Manual Therapy   Manual Therapy --  scar massage                   PT Short Term Goals - 10/05/16 1657      PT SHORT TERM GOAL #1   Title independent with initial HEP (10/08/16)   Time 4   Period Weeks   Status Achieved     PT SHORT TERM GOAL #2   Title Increase AROM Rt knee 0-120 degrees 10/08/16   Time 4   Period Weeks   Status Partially Met     PT SHORT TERM GOAL #3   Title 4/5 to 4+/5 strength Rt LE 10/08/16   Time 4   Period Weeks   Status On-going     PT SHORT TERM GOAL #4   Title Ambulate with single point cane for 200-300 ft with good gait pattern 4/19;18   Time 4   Period Weeks    Status Achieved           PT Long Term Goals - 10/05/16 1651      PT LONG TERM GOAL #1   Title Ambulate without assistive device with good gait pattern 11/05/16   Time 8   Period Weeks   Status Achieved     PT LONG TERM GOAL #2   Title 4+/5 to 5/5 strength Rt LE 11/05/16   Time 8   Period Weeks   Status On-going     PT LONG TERM GOAL #3   Title AROM Rt knee 0-130 degrees 11/05/16   Time 8   Period Weeks   Status On-going     PT LONG TERM GOAL #4   Title Independent in HEP 11/05/16   Time 8   Period Weeks   Status On-going     PT LONG TERM GOAL #5   Title Improve FOTO to </= 45% limitation  11/05/16   Time 8   Period Weeks   Status On-going               Plan - 10/09/16 1534    Clinical Impression Statement Continued hip flexor tightness but less than previous visits. She tolerated the sitting hip flexor stretch. ROM and strength are increasing. patient is now ambulating without assistive device for shoudler distances in the home and with straight cane out of the house. Progressing well toward stated goals of therapy. Will benefit from continued PT to progress to maximum benefit.    Rehab Potential Good   PT Frequency 2x / week   PT Duration 8 weeks   PT Treatment/Interventions ADLs/Self Care Home Management;Cryotherapy;Electrical Stimulation;Moist Heat;Therapeutic exercise;Therapeutic activities;Functional mobility training;Stair training;Gait training;Patient/family education;Manual techniques;Scar mobilization;Passive range of motion;Vasopneumatic Device;Taping;Dry needling   PT Next Visit Plan Continue progressive Rt knee ROM/strengthening/gait. Assess response to treatment with focus on the quad tightness. Progress HEP as indicated.    Consulted and Agree with Plan of Care Patient  Patient will benefit from skilled therapeutic intervention in order to improve the following deficits and impairments:  Abnormal gait, Decreased mobility, Decreased range of  motion, Decreased strength, Difficulty walking, Pain  Visit Diagnosis: Acute pain of right knee  Other abnormalities of gait and mobility  Stiffness of right knee, not elsewhere classified     Problem List Patient Active Problem List   Diagnosis Date Noted  . Hyperglycemia 04/24/2014  . Hypokalemia 09/02/2013  . Routine general medical examination at a health care facility 09/01/2013  . Low back pain 08/31/2012  . Encounter for long-term (current) use of other medications 08/13/2011  . 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 Numbness 08/13/2011  . HYPERCHOLESTEROLEMIA 08/11/2010  . Asymptomatic postmenopausal status 08/03/2008  . Anemia 08/03/2007  . NONSPECIFIC ABNORMAL ELECTROCARDIOGRAM 08/03/2007  . HYPERTENSION 02/19/2007  . ALLERGIC RHINITIS 02/19/2007  . OSTEOARTHRITIS 02/19/2007    Daneya Hartgrove Nilda Simmer PT, MPH  10/09/2016, 4:04 PM  [facility] [place], [place], [address] Phone: [phone]   Fax:  [phone]  Name: [name] MRN: [id number] Date of Birth: March 07, 1946

## 2016-10-12 ENCOUNTER — Encounter: Payer: Self-pay | Admitting: Rehabilitative and Restorative Service Providers"

## 2016-10-12 ENCOUNTER — Ambulatory Visit (INDEPENDENT_AMBULATORY_CARE_PROVIDER_SITE_OTHER): Payer: Medicare Other | Admitting: Rehabilitative and Restorative Service Providers"

## 2016-10-12 DIAGNOSIS — M25661 Stiffness of right knee, not elsewhere classified: Secondary | ICD-10-CM | POA: Diagnosis not present

## 2016-10-12 DIAGNOSIS — R2689 Other abnormalities of gait and mobility: Secondary | ICD-10-CM | POA: Diagnosis not present

## 2016-10-12 DIAGNOSIS — M25561 Pain in right knee: Secondary | ICD-10-CM | POA: Diagnosis not present

## 2016-10-12 NOTE — Patient Instructions (Signed)
Hip External Rotation With Pillow: Transverse Plane Stability   One knee bent, one leg straight, on pillow. Slowly roll bent knee out. Be sure pelvis does not rotate. Do _10__ times. Restabilize pelvis. Repeat with other leg. Do _1-2__ sets, _1__ times per day. Use green theraband.   Bridging    Slowly raise buttocks from floor, keeping stomach tight. Repeat __10__ times per set. Do _1-3___ sets per session. Do __1__ sessions per day.   [facility] [place], [place] [address]  [phone] (office) [phone] (fax)

## 2016-10-12 NOTE — Therapy (Signed)
[place], [place], [address] Phone: [phone]   Fax:  [phone]  Physical Therapy Treatment  Patient Details  Name: [name] MRN: [id number] Date of Birth: 01/29/1946 Referring Provider: Dr Corky Mull   Encounter Date: 10/12/2016      PT End of Session - 10/12/16 1439    Visit Number 11   Number of Visits 18   Date for PT Re-Evaluation 11/05/16   Authorization Type [facility] [employer] G code 20th visit   PT Start Time 1435   PT Stop Time 1533   PT Time Calculation (min) 58 min   Activity Tolerance Patient tolerated treatment well      Past Medical History:  Diagnosis Date  . Arthritis   . Blood transfusion    for GI bleed 1987  . Diabetes mellitus   . Hypertension   . Thyroid disease     Past Surgical History:  Procedure Laterality Date  . rt knee arthroscopy    . THYROIDECTOMY, PARTIAL      There were no vitals filed for this visit.      Subjective Assessment - 10/12/16 1440    Subjective [name] reports that she did a lot more walking this weekend and also wore pants that were tighter on the knee and she notices that she is having some swelling in the knee that she hasn't had since the week after surgery.    Currently in Pain? Yes   Pain Score 2    Pain Location Knee   Pain Orientation Right   Pain Descriptors / Indicators Aching;Tightness   Pain Type Chronic pain;Surgical pain   Pain Onset More than a month ago   Pain Frequency Intermittent            [facility] PT Assessment - 10/12/16 0001      Assessment   Medical Diagnosis Rt UKA    Referring Provider Dr Corky Mull    Onset Date/Surgical Date 09/08/16   Hand Dominance Right   Next MD Visit 10/22/16     Observation/Other Assessments-Edema    Edema --  increased edema suprapatellar/distal quad area      Palpation   Patella mobility decreased patellar mobility                      [facility]  Adult PT Treatment/Exercise - 10/12/16 0001      Knee/Hip Exercises: Stretches   Passive Hamstring Stretch Right;3 reps;30 seconds   Other Knee/Hip Stretches hip flexor stretch sitting sideways on chair dropping Rt knee toward floor extensing hip 30-45 sec x 2 reps     Knee/Hip Exercises: Aerobic   Nustep L5 x 6 min, 65 SPM     Knee/Hip Exercises: Standing   Heel Raises Both;20 reps   Lateral Step Up --  c/o pain post 3 reps 4 inch step - stopped   Forward Step Up Right;2 sets;10 reps;Hand Hold: 2;Step Height: 4"   Functional Squat 20 reps     Knee/Hip Exercises: Supine   Quad Sets Strengthening;Right;10 reps  10 sec hold    Terminal Knee Extension Strengthening;Right;2 sets;5 reps   Other Supine Knee/Hip Exercises hip abduction in hooklying green TB Rt/Lt 10 each 3-4 sec hold      Moist Heat Therapy   Number Minutes Moist Heat 15 Minutes   Moist Heat Location Hip  hip flexors/abdomen      Electrical Stimulation   Electrical Stimulation Location Rt knee  Astronomer Parameters to tolerance   Electrical Stimulation Goals Pain;Tone     Vasopneumatic   Number Minutes Vasopneumatic  15 minutes   Vasopnuematic Location  Knee   Vasopneumatic Pressure Low   Vasopneumatic Temperature  34 deg                PT Education - 10/12/16 1458    Education provided Yes   Education Details HEP    Person(s) Educated Patient   Methods Explanation;Demonstration;Tactile cues;Verbal cues;Handout   Comprehension Verbalized understanding;Returned demonstration;Verbal cues required;Tactile cues required          PT Short Term Goals - 10/05/16 1657      PT SHORT TERM GOAL #1   Title independent with initial HEP (10/08/16)   Time 4   Period Weeks   Status Achieved     PT SHORT TERM GOAL #2   Title Increase AROM Rt knee 0-120 degrees 10/08/16   Time 4   Period Weeks   Status Partially Met     PT SHORT TERM GOAL #3    Title 4/5 to 4+/5 strength Rt LE 10/08/16   Time 4   Period Weeks   Status On-going     PT SHORT TERM GOAL #4   Title Ambulate with single point cane for 200-300 ft with good gait pattern 4/19;18   Time 4   Period Weeks   Status Achieved           PT Long Term Goals - 10/12/16 1438      PT LONG TERM GOAL #1   Title Ambulate without assistive device with good gait pattern 11/05/16   Time 8   Period Weeks   Status Achieved     PT LONG TERM GOAL #2   Title 4+/5 to 5/5 strength Rt LE 11/05/16   Time 8   Period Weeks   Status On-going     PT LONG TERM GOAL #3   Title AROM Rt knee 0-130 degrees 11/05/16   Time 8   Period Weeks   Status On-going     PT LONG TERM GOAL #4   Title Independent in HEP 11/05/16   Time 8   Period Weeks   Status On-going     PT LONG TERM GOAL #5   Title Improve FOTO to </= 45% limitation  11/05/16   Time 8   Period Weeks   Status On-going               Plan - 10/12/16 1458    Clinical Impression Statement Some increase in edema and discomfort today likely related to the increase in standing and walking over the weekend. Patient was also in tightner pants which may have contributed to the problem. Noted edema in the superior aspect of he patella. She added exercise today without minimal difficulty providing rest breaks. Progressing well toward goals of therapy.    Rehab Potential Good   PT Frequency 2x / week   PT Duration 8 weeks   PT Treatment/Interventions ADLs/Self Care Home Management;Cryotherapy;Electrical Stimulation;Moist Heat;Therapeutic exercise;Therapeutic activities;Functional mobility training;Stair training;Gait training;Patient/family education;Manual techniques;Scar mobilization;Passive range of motion;Vasopneumatic Device;Taping;Dry needling   PT Next Visit Plan Continue progressive Rt knee ROM/strengthening/gait. Assess response to treatment with focus on the quad tightness. Progress HEP as indicated.    Consulted and  Agree with Plan of Care Patient      Patient will benefit from skilled therapeutic intervention in order to improve the  following deficits and impairments:  Abnormal gait, Decreased mobility, Decreased range of motion, Decreased strength, Difficulty walking, Pain  Visit Diagnosis: Acute pain of right knee  Other abnormalities of gait and mobility  Stiffness of right knee, not elsewhere classified     Problem List Patient Active Problem List   Diagnosis Date Noted  . Hyperglycemia 04/24/2014  . Hypokalemia 09/02/2013  . Routine general medical examination at a health care facility 09/01/2013  . Low back pain 08/31/2012  . Encounter for long-term (current) use of other medications 08/13/2011  . 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 Numbness 08/13/2011  . HYPERCHOLESTEROLEMIA 08/11/2010  . Asymptomatic postmenopausal status 08/03/2008  . Anemia 08/03/2007  . NONSPECIFIC ABNORMAL ELECTROCARDIOGRAM 08/03/2007  . HYPERTENSION 02/19/2007  . ALLERGIC RHINITIS 02/19/2007  . OSTEOARTHRITIS 02/19/2007    Celyn Nilda Simmer PT, MPH  10/12/2016, 3:20 PM  [facility] [place], [place], [address] Phone: [phone]   Fax:  [phone]  Name: [name] MRN: [id number] Date of Birth: June 10, 1946

## 2016-10-15 ENCOUNTER — Ambulatory Visit (INDEPENDENT_AMBULATORY_CARE_PROVIDER_SITE_OTHER): Payer: Medicare Other | Admitting: Physical Therapy

## 2016-10-15 DIAGNOSIS — M25561 Pain in right knee: Secondary | ICD-10-CM

## 2016-10-15 DIAGNOSIS — R2689 Other abnormalities of gait and mobility: Secondary | ICD-10-CM | POA: Diagnosis not present

## 2016-10-15 DIAGNOSIS — M25661 Stiffness of right knee, not elsewhere classified: Secondary | ICD-10-CM | POA: Diagnosis not present

## 2016-10-15 NOTE — Therapy (Signed)
[place] [place], [place], [address] Phone: [phone]   Fax:  [phone]  Physical Therapy Treatment  Patient Details  Name: [name] MRN: [id number] Date of Birth: [birth year] Referring Provider: Dr. Corky Mull  Encounter Date: 10/15/2016      PT End of Session - 10/15/16 1450    Visit Number 12   Number of Visits 18   Date for PT Re-Evaluation 11/05/16   Authorization Type [facility] [employer] G code 20th visit   PT Start Time 1449   PT Stop Time 1543   PT Time Calculation (min) 54 min   Activity Tolerance Patient tolerated treatment well   Behavior During Therapy [facility] for tasks assessed/performed      Past Medical History:  Diagnosis Date  . Arthritis   . Blood transfusion    for GI bleed 1987  . Diabetes mellitus   . Hypertension   . Thyroid disease     Past Surgical History:  Procedure Laterality Date  . rt knee arthroscopy    . THYROIDECTOMY, PARTIAL      There were no vitals filed for this visit.      Subjective Assessment - 10/15/16 1452    Subjective Pt reports she is still getting over the walking she did last weekend.  Swelling has gone down. No other changes since last visit.    Currently in Pain? Yes   Pain Score 1    Pain Location Knee   Pain Orientation Right   Pain Descriptors / Indicators Dull   Aggravating Factors  standing still    Pain Relieving Factors walking, ice             [facility] PT Assessment - 10/15/16 0001      Assessment   Medical Diagnosis Rt UKA    Referring Provider Dr. Corky Mull   Onset Date/Surgical Date 09/08/16   Hand Dominance Right   Next MD Visit 10/22/16     AROM   Right/Left Knee Right   Right Knee Extension 0   Right Knee Flexion 130           [facility] Adult PT Treatment/Exercise - 10/15/16 0001      Knee/Hip Exercises: Stretches   Passive Hamstring Stretch Right;3 reps;30 seconds   Quad Stretch Right;30 seconds;4 reps   standing   Gastroc Stretch Right;Left;2 reps;30 seconds   Other Knee/Hip Stretches hip flexor stretch sitting sideways on chair dropping Rt knee toward floor extensing hip 30-45 sec x 2 reps, Lt hip flexor stretch      Knee/Hip Exercises: Aerobic   Nustep L5 x 6 min, 65 SPM     Knee/Hip Exercises: Standing   Forward Step Up Right;1 set;10 reps;Hand Hold: 2;Step Height: 6"   Forward Step Up Limitations slow and controlled motion.    Wall Squat 1 set;10 reps   Other Standing Knee Exercises TKE for Rt knee with green band x 5 sec hold xn 12 reps      Knee/Hip Exercises: Supine   Quad Sets Right;1 set;5 reps   Short Arc [employer];Right;1 set;5 reps     Company secretary Action IFC   Electrical Stimulation Parameters to tolerance    Electrical Stimulation Goals Pain     Vasopneumatic   Number Minutes Vasopneumatic  15 minutes   Vasopnuematic Location  Knee   Vasopneumatic Pressure Low   Vasopneumatic Temperature  34  deg     Manual Therapy   Manual Therapy Passive ROM   Soft tissue mobilization working through Rt hip flexors and quads    Passive ROM overpressure into Rt knee ext.                   PT Short Term Goals - 10/15/16 1557      PT SHORT TERM GOAL #1   Title independent with initial HEP (10/08/16)   Time 4   Period Weeks   Status Achieved     PT SHORT TERM GOAL #2   Title Increase AROM Rt knee 0-120 degrees 10/08/16   Time 4   Period Weeks   Status Achieved     PT SHORT TERM GOAL #3   Title 4/5 to 4+/5 strength Rt LE 10/08/16   Time 4   Period Weeks   Status On-going     PT SHORT TERM GOAL #4   Title Ambulate with single point cane for 200-300 ft with good gait pattern 4/19;18   Time 4   Period Weeks   Status Achieved           PT Long Term Goals - 10/15/16 1512      PT LONG TERM GOAL #1   Title Ambulate without assistive device with good gait pattern  11/05/16   Time 8   Period Weeks   Status Achieved     PT LONG TERM GOAL #2   Title 4+/5 to 5/5 strength Rt LE 11/05/16   Time 8   Period Weeks   Status On-going     PT LONG TERM GOAL #3   Title AROM Rt knee 0-130 degrees 11/05/16   Time 8   Period Weeks   Status Achieved     PT LONG TERM GOAL #4   Title Independent in HEP 11/05/16   Time 8   Period Weeks   Status On-going     PT LONG TERM GOAL #5   Title Improve FOTO to </= 45% limitation  11/05/16   Time 8   Period Weeks   Status On-going               Plan - 10/15/16 1532    Clinical Impression Statement Pt demonstrated improved knee ROM; has met STG #2 and LTG #3.  She tolerated all exercises well, with minimal increase in pain.  Pt is making great gains towards remaining goals.    Rehab Potential Good   PT Frequency 2x / week   PT Duration 8 weeks   PT Treatment/Interventions ADLs/Self Care Home Management;Cryotherapy;Electrical Stimulation;Moist Heat;Therapeutic exercise;Therapeutic activities;Functional mobility training;Stair training;Gait training;Patient/family education;Manual techniques;Scar mobilization;Passive range of motion;Vasopneumatic Device;Taping;Dry needling   PT Next Visit Plan measure strength and assess goals for MD note ( appt 5/3)   Consulted and Agree with Plan of Care Patient      Patient will benefit from skilled therapeutic intervention in order to improve the following deficits and impairments:  Abnormal gait, Decreased mobility, Decreased range of motion, Decreased strength, Difficulty walking, Pain  Visit Diagnosis: Acute pain of right knee  Other abnormalities of gait and mobility  Stiffness of right knee, not elsewhere classified     Problem List Patient Active Problem List   Diagnosis Date Noted  . Hyperglycemia 04/24/2014  . Hypokalemia 09/02/2013  . Routine general medical examination at a health care facility 09/01/2013  . Low back pain 08/31/2012  . Encounter for  long-term (current) use of other medications 08/13/2011  .  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 Numbness 08/13/2011  . HYPERCHOLESTEROLEMIA 08/11/2010  . Asymptomatic postmenopausal status 08/03/2008  . Anemia 08/03/2007  . NONSPECIFIC ABNORMAL ELECTROCARDIOGRAM 08/03/2007  . HYPERTENSION 02/19/2007  . ALLERGIC RHINITIS 02/19/2007  . OSTEOARTHRITIS 02/19/2007   Kerin Perna, PTA 10/15/16 4:06 PM  [facility] [place], [place], [address] Phone: [phone]   Fax:  [phone]  Name: [name] MRN: [id number] Date of Birth: 1945-07-09

## 2016-10-16 ENCOUNTER — Other Ambulatory Visit: Payer: Self-pay | Admitting: Endocrinology

## 2016-10-16 DIAGNOSIS — Z1231 Encounter for screening mammogram for malignant neoplasm of breast: Secondary | ICD-10-CM

## 2016-10-19 ENCOUNTER — Ambulatory Visit (INDEPENDENT_AMBULATORY_CARE_PROVIDER_SITE_OTHER): Payer: Medicare Other | Admitting: Rehabilitative and Restorative Service Providers"

## 2016-10-19 ENCOUNTER — Encounter: Payer: Self-pay | Admitting: Rehabilitative and Restorative Service Providers"

## 2016-10-19 DIAGNOSIS — R2689 Other abnormalities of gait and mobility: Secondary | ICD-10-CM | POA: Diagnosis not present

## 2016-10-19 DIAGNOSIS — M25661 Stiffness of right knee, not elsewhere classified: Secondary | ICD-10-CM

## 2016-10-19 DIAGNOSIS — M25561 Pain in right knee: Secondary | ICD-10-CM | POA: Diagnosis not present

## 2016-10-19 NOTE — Therapy (Signed)
[place], [place], [address] Phone: [phone]   Fax:  [phone]  Physical Therapy Treatment  Patient Details  Name: [name] MRN: [id number] Date of Birth: 30-Dec-1945 Referring Provider: Dr. Corky Mull  Encounter Date: 10/19/2016      PT End of Session - 10/19/16 1524    Visit Number 13   Number of Visits 18   Date for PT Re-Evaluation 11/05/16   Authorization Type [facility] G code 20th visit   PT Start Time 1415   PT Stop Time 1512   PT Time Calculation (min) 57 min   Activity Tolerance Patient tolerated treatment well      Past Medical History:  Diagnosis Date  . Arthritis   . Blood transfusion    for GI bleed 1987  . Diabetes mellitus   . Hypertension   . Thyroid disease     Past Surgical History:  Procedure Laterality Date  . rt knee arthroscopy    . THYROIDECTOMY, PARTIAL      There were no vitals filed for this visit.      Subjective Assessment - 10/19/16 1527    Subjective Patient reports that she continues to improve. She returns to MD Thursday. Will see [place] Wednesday for therapy.    Currently in Pain? Yes   Pain Score 1   not pain - but "can feel it"    Pain Location Knee   Pain Orientation Right   Pain Descriptors / Indicators Dull   Pain Type Chronic pain;Surgical pain   Pain Onset More than a month ago   Pain Frequency Intermittent   Aggravating Factors  standing still    Pain Relieving Factors walking; ice                          [facility] Adult PT Treatment/Exercise - 10/19/16 0001      Therapeutic Activites    Therapeutic Activities --  myofacial ball release Rt posterior hip area      Knee/Hip Exercises: Stretches   Quad Stretch Right;30 seconds;3 reps  prone with strap - trial to see if she tolerates    Gastroc Stretch Right;Left;2 reps;30 seconds   Other Knee/Hip Stretches hip flexor stretch sitting sideways on chair  dropping Rt knee toward floor extensing hip 30-45 sec x 2 reps, Lt hip flexor stretch      Knee/Hip Exercises: Aerobic   Nustep L5 x 7 min, 65 SPM     Knee/Hip Exercises: Standing   Forward Step Up Right;10 reps;Hand Hold: 2;Step Height: 6";2 sets   Forward Step Up Limitations slow and controlled motion.    Wall Squat 1 set;5 sets  ball on wall    SLS Rt/Lt SLS x 4 each LE 15-20 sec    Other Standing Knee Exercises hip ewxtension with hips presseed forward at counter to encourage imporved hip extension      Moist Heat Therapy   Number Minutes Moist Heat 15 Minutes   Moist Heat Location Hip  anterior hip and quad      Electrical Stimulation   Electrical Stimulation Location Rt knee    Electrical Stimulation Action IFC   Electrical Stimulation Parameters to tolerance   Electrical Stimulation Goals Pain     Vasopneumatic   Number Minutes Vasopneumatic  15 minutes   Vasopnuematic Location  Knee   Vasopneumatic Pressure Low   Vasopneumatic Temperature  34 deg  Manual Therapy   Soft tissue mobilization working through Rt hip flexors and quads                   PT Short Term Goals - 10/15/16 1557      PT SHORT TERM GOAL #1   Title independent with initial HEP (10/08/16)   Time 4   Period Weeks   Status Achieved     PT SHORT TERM GOAL #2   Title Increase AROM Rt knee 0-120 degrees 10/08/16   Time 4   Period Weeks   Status Achieved     PT SHORT TERM GOAL #3   Title 4/5 to 4+/5 strength Rt LE 10/08/16   Time 4   Period Weeks   Status On-going     PT SHORT TERM GOAL #4   Title Ambulate with single point cane for 200-300 ft with good gait pattern 4/19;18   Time 4   Period Weeks   Status Achieved           PT Long Term Goals - 10/19/16 1531      PT LONG TERM GOAL #1   Title Ambulate without assistive device with good gait pattern 11/05/16   Time 8   Period Weeks   Status Achieved     PT LONG TERM GOAL #2   Title 4+/5 to 5/5 strength Rt LE 11/05/16    Time 8   Period Weeks   Status On-going     PT LONG TERM GOAL #3   Title AROM Rt knee 0-130 degrees 11/05/16   Time 8   Period Weeks   Status Achieved     PT LONG TERM GOAL #4   Title Independent in HEP 11/05/16   Time 8   Period Weeks   Status On-going     PT LONG TERM GOAL #5   Title Improve FOTO to </= 45% limitation  11/05/16   Time 8   Period Weeks   Status On-going               Plan - 10/19/16 1529    Clinical Impression Statement continued progress with increasing ROM and strength with decreased strength. Working on exercises at home and is doing more walking. Continued progress toward stated goals of therapy. Reassess for MD note at next visit.    Rehab Potential Good   PT Frequency 2x / week   PT Duration 8 weeks   PT Treatment/Interventions ADLs/Self Care Home Management;Cryotherapy;Electrical Stimulation;Moist Heat;Therapeutic exercise;Therapeutic activities;Functional mobility training;Stair training;Gait training;Patient/family education;Manual techniques;Scar mobilization;Passive range of motion;Vasopneumatic Device;Taping;Dry needling   PT Next Visit Plan measure strength and assess goals for MD note ( appt 5/3)   Consulted and Agree with Plan of Care Patient      Patient will benefit from skilled therapeutic intervention in order to improve the following deficits and impairments:  Abnormal gait, Decreased mobility, Decreased range of motion, Decreased strength, Difficulty walking, Pain  Visit Diagnosis: Acute pain of right knee  Other abnormalities of gait and mobility  Stiffness of right knee, not elsewhere classified     Problem List Patient Active Problem List   Diagnosis Date Noted  . Hyperglycemia 04/24/2014  . Hypokalemia 09/02/2013  . Routine general medical examination at a health care facility 09/01/2013  . Low back pain 08/31/2012  . Encounter for long-term (current) use of other medications 08/13/2011  . Postsurgical  hypothyroidism 08/13/2011  . Numbness 08/13/2011  . HYPERCHOLESTEROLEMIA 08/11/2010  . Asymptomatic postmenopausal status 08/03/2008  .  Anemia 08/03/2007  . NONSPECIFIC ABNORMAL ELECTROCARDIOGRAM 08/03/2007  . HYPERTENSION 02/19/2007  . ALLERGIC RHINITIS 02/19/2007  . OSTEOARTHRITIS 02/19/2007    Celyn Nilda Simmer PT, MPH  10/19/2016, 4:14 PM  [facility] [place], [place], [address] Phone: [phone]   Fax:  [phone]  Name: [name] MRN: [id number] Date of Birth: 1945-10-28

## 2016-10-21 ENCOUNTER — Encounter: Payer: Self-pay | Admitting: Rehabilitative and Restorative Service Providers"

## 2016-10-21 ENCOUNTER — Ambulatory Visit (INDEPENDENT_AMBULATORY_CARE_PROVIDER_SITE_OTHER): Payer: Medicare Other | Admitting: Rehabilitative and Restorative Service Providers"

## 2016-10-21 DIAGNOSIS — M25661 Stiffness of right knee, not elsewhere classified: Secondary | ICD-10-CM | POA: Diagnosis not present

## 2016-10-21 DIAGNOSIS — R2689 Other abnormalities of gait and mobility: Secondary | ICD-10-CM

## 2016-10-21 DIAGNOSIS — M25561 Pain in right knee: Secondary | ICD-10-CM | POA: Diagnosis not present

## 2016-10-21 NOTE — Therapy (Signed)
[place], [place], [address] Phone: [phone]   Fax:  [phone]  Physical Therapy Treatment  Patient Details  Name: [name] MRN: [id number] Date of Birth: 04-13-1946 Referring Provider: Dr Corky Mull  Encounter Date: 10/21/2016      PT End of Session - 10/21/16 1434    Visit Number 14   Number of Visits 18   Date for PT Re-Evaluation 11/05/16   Authorization Type [facility] G code 20th visit   PT Start Time 1434   PT Stop Time 1530   PT Time Calculation (min) 56 min   Activity Tolerance Patient tolerated treatment well      Past Medical History:  Diagnosis Date  . Arthritis   . Blood transfusion    for GI bleed 1987  . Diabetes mellitus   . Hypertension   . Thyroid disease     Past Surgical History:  Procedure Laterality Date  . rt knee arthroscopy    . THYROIDECTOMY, PARTIAL      There were no vitals filed for this visit.      Subjective Assessment - 10/21/16 1437    Subjective Patient reports that she continues to have intermittent pain and soreness in the Rt knee. Pain is primarily in the medial tibial area just distal to the joint line. Ice does help decrease pain and [name] notices that the pain is greatest when she lies down to sleep at night. [name] reports some pain in the anterior Rt ankle again, mostly at night when she lies down to sleep.    Pain Score 1    Pain Location Knee   Pain Orientation Right   Pain Descriptors / Indicators Dull   Pain Type Chronic pain;Surgical pain   Pain Radiating Towards pain in inside Rt knee area just below the knee - mostly at night    Pain Onset More than a month ago   Aggravating Factors  lying down to sleep; standing still    Pain Relieving Factors walking; ice             [name] Assessment - 10/21/16 0001      Assessment   Medical Diagnosis Rt UKA    Referring Provider Dr Corky Mull   Onset  Date/Surgical Date 09/08/16   Hand Dominance Right   Next MD Visit 10/22/16     Observation/Other Assessments   Focus on Therapeutic Outcomes (FOTO)  55% limitation      AROM   Right Knee Extension 0   Right Knee Flexion 132     Strength   Right/Left Hip --  hip extension assessed in supine d/t LBP with prone position   Right Hip Flexion --  5-/5   Right Hip Extension 4+/5   Right Hip ABduction 5/5   Left Hip Flexion 5/5   Left Hip Extension 4+/5   Left Hip ABduction --  5-/5   Right Knee Flexion --  5-/5   Right Knee Extension 5/5   Left Knee Flexion 5/5   Left Knee Extension 5/5     Palpation   Patella mobility decreased patellar mobility    Palpation comment muscular tightness Rt hip flexors; adductors                      [facility] Adult PT Treatment/Exercise - 10/21/16 0001      Knee/Hip Exercises: Stretches   Sports administrator Right;30 seconds;3 reps  sitting  Gastroc Stretch Right;Left;2 reps;30 seconds   Other Knee/Hip Stretches hip flexor stretch sitting sideways on chair dropping Rt knee toward floor extensing hip 30-45 sec x 2 reps, Lt hip flexor stretch      Knee/Hip Exercises: Aerobic   Nustep L5 x 7 min, 65 SPM     Knee/Hip Exercises: Standing   Forward Step Up Right;10 reps;Hand Hold: 2;Step Height: 6";2 sets   Forward Step Up Limitations slow and controlled motion.    Wall Squat 1 set;5 sets  ball on wall    SLS Rt/Lt SLS x 4 each LE 15-20 sec    Other Standing Knee Exercises standing Lt LE on 4 inch step for shallow knee bend on Rt x 10      Moist Heat Therapy   Number Minutes Moist Heat 15 Minutes   Moist Heat Location Hip  anterior hip and quad      Electrical Stimulation   Electrical Stimulation Location Rt knee    Electrical Stimulation Action IFC   Electrical Stimulation Parameters to tolerance   Electrical Stimulation Goals Pain     Vasopneumatic   Number Minutes Vasopneumatic  15 minutes   Vasopnuematic Location  Knee    Vasopneumatic Pressure Low   Vasopneumatic Temperature  34 deg     Manual Therapy   Soft tissue mobilization working through Rt hip flexors and quads                   PT Short Term Goals - 10/21/16 1436      PT SHORT TERM GOAL #1   Title independent with initial HEP (10/08/16)   Time 4   Period Weeks   Status Achieved     PT SHORT TERM GOAL #2   Title Increase AROM Rt knee 0-120 degrees 10/08/16   Time 4   Period Weeks   Status Achieved     PT SHORT TERM GOAL #3   Title 4/5 to 4+/5 strength Rt LE 10/08/16   Time 4   Period Weeks   Status On-going     PT SHORT TERM GOAL #4   Title Ambulate with single point cane for 200-300 ft with good gait pattern 4/19;18   Time 4   Period Weeks   Status Achieved           PT Long Term Goals - 10/21/16 1435      PT LONG TERM GOAL #1   Title Ambulate without assistive device with good gait pattern 11/05/16   Time 8   Period Weeks   Status Achieved     PT LONG TERM GOAL #2   Title 4+/5 to 5/5 strength Rt LE 11/05/16   Time 8   Period Weeks   Status Achieved     PT LONG TERM GOAL #3   Title AROM Rt knee 0-130 degrees 11/05/16   Time 8   Period Weeks   Status Achieved     PT LONG TERM GOAL #4   Title Independent in HEP 11/05/16   Time 8   Period Weeks   Status On-going     PT LONG TERM GOAL #5   Title Improve FOTO to </= 45% limitation  11/05/16   Time 8   Period Weeks   Status On-going               Plan - 10/21/16 1441    Clinical Impression Statement Gradual porgress 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 [name] demonstrates good gains  in ROM and strength Rt LE. Gait is improved with patient ambulating without assistive devices in the home and on level surfaces. She continues to use single point cane with unlevel surfaces and for community distances - for safety. Progressing well with rehab. Will benefit form continued PT to address remaining deficits and reach maximum rehab  potential.    Rehab Potential Good   PT Frequency 2x / week   PT Duration 8 weeks   PT Treatment/Interventions ADLs/Self Care Home Management;Cryotherapy;Electrical Stimulation;Moist Heat;Therapeutic exercise;Therapeutic activities;Functional mobility training;Stair training;Gait training;Patient/family education;Manual techniques;Scar mobilization;Passive range of motion;Vasopneumatic Device;Taping;Dry needling   PT Next Visit Plan continue rehab focus on strengthening and ROM as well as improving gait pattern. further assess ankle discomfort as indicated (trial of stretch for ankle DF) - note to MD today   Consulted and Agree with Plan of Care Patient      Patient will benefit from skilled therapeutic intervention in order to improve the following deficits and impairments:  Abnormal gait, Decreased mobility, Decreased range of motion, Decreased strength, Difficulty walking, Pain  Visit Diagnosis: Acute pain of right knee  Other abnormalities of gait and mobility  Stiffness of right knee, not elsewhere classified     Problem List Patient Active Problem List   Diagnosis Date Noted  . Hyperglycemia 04/24/2014  . Hypokalemia 09/02/2013  . Routine general medical examination at a health care facility 09/01/2013  . Low back pain 08/31/2012  . Encounter for long-term (current) use of other medications 08/13/2011  . 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 Numbness 08/13/2011  . HYPERCHOLESTEROLEMIA 08/11/2010  . Asymptomatic postmenopausal status 08/03/2008  . Anemia 08/03/2007  . NONSPECIFIC ABNORMAL ELECTROCARDIOGRAM 08/03/2007  . HYPERTENSION 02/19/2007  . ALLERGIC RHINITIS 02/19/2007  . OSTEOARTHRITIS 02/19/2007    [name] PT, MPH  10/21/2016, 3:18 PM  [facility] [place], [place], [address] Phone: [phone]   Fax:  [phone]  Name: TYSHIKA BALDRIDGE MRN: [id number] Date of Birth:  01/07/1946

## 2016-10-22 DIAGNOSIS — Z96651 Presence of right artificial knee joint: Secondary | ICD-10-CM | POA: Diagnosis not present

## 2016-10-22 DIAGNOSIS — Z471 Aftercare following joint replacement surgery: Secondary | ICD-10-CM | POA: Diagnosis not present

## 2016-10-28 ENCOUNTER — Encounter: Payer: Self-pay | Admitting: Rehabilitative and Restorative Service Providers"

## 2016-10-28 ENCOUNTER — Ambulatory Visit (INDEPENDENT_AMBULATORY_CARE_PROVIDER_SITE_OTHER): Payer: Medicare Other | Admitting: Rehabilitative and Restorative Service Providers"

## 2016-10-28 ENCOUNTER — Ambulatory Visit (INDEPENDENT_AMBULATORY_CARE_PROVIDER_SITE_OTHER): Payer: Medicare Other

## 2016-10-28 DIAGNOSIS — M25561 Pain in right knee: Secondary | ICD-10-CM | POA: Diagnosis not present

## 2016-10-28 DIAGNOSIS — M25661 Stiffness of right knee, not elsewhere classified: Secondary | ICD-10-CM | POA: Diagnosis not present

## 2016-10-28 DIAGNOSIS — Z1231 Encounter for screening mammogram for malignant neoplasm of breast: Secondary | ICD-10-CM

## 2016-10-28 DIAGNOSIS — R2689 Other abnormalities of gait and mobility: Secondary | ICD-10-CM

## 2016-10-28 NOTE — Therapy (Addendum)
[place], [place], [address] Phone: [phone]   Fax:  [phone]  Physical Therapy Treatment  Patient Details  Name: [name] MRN: [id number] Date of Birth: 03-27-1946 Referring Provider: Dr Corky Mull  Encounter Date: 10/28/2016      PT End of Session - 10/28/16 1442    Visit Number 15   Number of Visits 18   Date for PT Re-Evaluation 11/05/16   Authorization Type [facility] Medicare G code 20th visit   PT Start Time 1433   PT Stop Time 1529   PT Time Calculation (min) 56 min   Activity Tolerance Patient tolerated treatment well      Past Medical History:  Diagnosis Date  . Arthritis   . Blood transfusion    for GI bleed 1987  . Diabetes mellitus   . Hypertension   . Thyroid disease     Past Surgical History:  Procedure Laterality Date  . BREAST BIOPSY    . rt knee arthroscopy    . THYROIDECTOMY, PARTIAL      There were no vitals filed for this visit.      Subjective Assessment - 10/28/16 1457    Subjective Patient was seen by MD yesterday - he is very pleased with her progress and feels that she is "ahead of where she should be". [name] is pleased with her progress and confident in continuing with home program independently.    Currently in Pain? No/denies            [facility] PT Assessment - 10/28/16 0001      Assessment   Medical Diagnosis Rt UKA    Referring Provider Dr Corky Mull   Onset Date/Surgical Date 09/08/16   Hand Dominance Right   Next MD Visit 10/22/16     Observation/Other Assessments   Focus on Therapeutic Outcomes (FOTO)  51% limitation      AROM   Right Knee Flexion 132     Strength   Right/Left Hip --  assessed with pt in supine    Right Hip Flexion --  5-/5    Right Hip Extension 4+/5   Right Hip ABduction 5/5   Left Hip Flexion 5/5   Left Hip Extension 4+/5   Left Hip ABduction --  5-/5   Right Knee Flexion --  5-/5   Right  Knee Extension 5/5   Left Knee Flexion 5/5   Left Knee Extension 5/5     Palpation   Patella mobility decreased patellar mobility    Palpation comment muscular tightness Rt hip flexors; adductors                      [facility] Adult PT Treatment/Exercise - 10/28/16 0001      Knee/Hip Exercises: Stretches   Sports administrator Right;30 seconds;3 reps  sitting    Other Knee/Hip Stretches hip flexor stretch sitting sideways on chair dropping Rt knee toward floor extensing hip 30-45 sec x 2 reps, Lt hip flexor stretch      Knee/Hip Exercises: Aerobic   Nustep L5 x 7 min, 65 SPM     Knee/Hip Exercises: Standing   Forward Step Up Right;10 reps;Hand Hold: 2;Step Height: 6";2 sets   Forward Step Up Limitations slow and controlled motion.    SLS Rt/Lt SLS x 4 each LE 15-20 sec    Other Standing Knee Exercises standing Lt LE on 4 inch step for shallow knee bend on Rt x  10      Knee/Hip Exercises: Seated   Sit to Sand 10 reps;without UE support     Electrical Stimulation   Electrical Stimulation Location Rt knee    Electrical Stimulation Action IFC   Electrical Stimulation Parameters to tolerance   Electrical Stimulation Goals Pain     Vasopneumatic   Number Minutes Vasopneumatic  15 minutes   Vasopnuematic Location  Knee   Vasopneumatic Pressure Low   Vasopneumatic Temperature  34 deg                  PT Short Term Goals - 10/28/16 1456      PT SHORT TERM GOAL #1   Title independent with initial HEP (10/08/16)   Time 4   Period Weeks   Status Achieved     PT SHORT TERM GOAL #2   Title Increase AROM Rt knee 0-120 degrees 10/08/16   Time 4   Period Weeks   Status Achieved     PT SHORT TERM GOAL #3   Title 4/5 to 4+/5 strength Rt LE 10/08/16   Time 4   Period Weeks   Status Achieved     PT SHORT TERM GOAL #4   Title Ambulate with single point cane for 200-300 ft with good gait pattern 4/19;18   Time 4   Period Weeks   Status Achieved           PT  Long Term Goals - 10/28/16 1443      PT LONG TERM GOAL #1   Title Ambulate without assistive device with good gait pattern 11/05/16   Time 8   Period Weeks   Status Achieved     PT LONG TERM GOAL #2   Title 4+/5 to 5/5 strength Rt LE 11/05/16   Time 8   Period Weeks   Status Achieved     PT LONG TERM GOAL #3   Title AROM Rt knee 0-130 degrees 11/05/16   Time 8   Period Weeks   Status Achieved     PT LONG TERM GOAL #4   Title Independent in HEP 11/05/16   Time 8   Period Weeks   Status On-going     PT LONG TERM GOAL #5   Title Improve FOTO to </= 45% limitation  11/05/16   Time 8   Period Weeks   Status Partially Met               Plan - 10/28/16 1446    Clinical Impression Statement Excellent progress with rehab. Patient is pleased with progress and confident in continuing with independent HEP. Goals of therapy have been accomplished.    Rehab Potential Good   PT Frequency 2x / week   PT Duration 8 weeks   PT Treatment/Interventions ADLs/Self Care Home Management;Cryotherapy;Electrical Stimulation;Moist Heat;Therapeutic exercise;Therapeutic activities;Functional mobility training;Stair training;Gait training;Patient/family education;Manual techniques;Scar mobilization;Passive range of motion;Vasopneumatic Device;Taping;Dry needling   PT Next Visit Plan D/C to independent HEP Patient will call with any questions or problems    Consulted and Agree with Plan of Care Patient      Patient will benefit from skilled therapeutic intervention in order to improve the following deficits and impairments:  Abnormal gait, Decreased mobility, Decreased range of motion, Decreased strength, Difficulty walking, Pain  Visit Diagnosis: Acute pain of right knee  Other abnormalities of gait and mobility  Stiffness of right knee, not elsewhere classified     Problem List Patient Active Problem List   Diagnosis Date Noted  .  Hyperglycemia 04/24/2014  . Hypokalemia 09/02/2013   . Routine general medical examination at a health care facility 09/01/2013  . Low back pain 08/31/2012  . Encounter for long-term (current) use of other medications 08/13/2011  . 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 Numbness 08/13/2011  . HYPERCHOLESTEROLEMIA 08/11/2010  . Asymptomatic postmenopausal status 08/03/2008  . Anemia 08/03/2007  . NONSPECIFIC ABNORMAL ELECTROCARDIOGRAM 08/03/2007  . HYPERTENSION 02/19/2007  . ALLERGIC RHINITIS 02/19/2007  . OSTEOARTHRITIS 02/19/2007    Tung Pustejovsky Nilda Simmer PT, MPH  10/28/2016, 4:15 PM  [facility] [place], [place], [address] Phone: [phone]   Fax:  [phone]  Name: VONA WHITERS MRN: [id number] Date of Birth: 1945/09/06   PHYSICAL THERAPY DISCHARGE SUMMARY  Visits from Start of Care: 15  Current functional level related to goals / functional outcomes: See progress note for discharge status. Excellent progress    Remaining deficits: Needs to continue with home program.    Education / Equipment: HEP  Plan: Patient agrees to discharge.  Patient goals were met. Patient is being discharged due to meeting the stated rehab goals.  ?????    Obera Stauch P. Helene Kelp PT, MPH 10/28/16 4:20 PM

## 2016-10-30 ENCOUNTER — Encounter: Payer: Self-pay | Admitting: Rehabilitative and Restorative Service Providers"

## 2016-11-17 ENCOUNTER — Other Ambulatory Visit (INDEPENDENT_AMBULATORY_CARE_PROVIDER_SITE_OTHER): Payer: Medicare Other

## 2016-11-17 ENCOUNTER — Encounter: Payer: Self-pay | Admitting: Endocrinology

## 2016-11-17 ENCOUNTER — Telehealth: Payer: Self-pay | Admitting: Endocrinology

## 2016-11-17 ENCOUNTER — Ambulatory Visit (INDEPENDENT_AMBULATORY_CARE_PROVIDER_SITE_OTHER): Payer: Medicare Other | Admitting: Endocrinology

## 2016-11-17 VITALS — BP 104/70 | HR 71 | Ht 63.0 in | Wt 162.0 lb

## 2016-11-17 DIAGNOSIS — Z0000 Encounter for general adult medical examination without abnormal findings: Secondary | ICD-10-CM | POA: Diagnosis not present

## 2016-11-17 DIAGNOSIS — Z780 Asymptomatic menopausal state: Secondary | ICD-10-CM

## 2016-11-17 DIAGNOSIS — E119 Type 2 diabetes mellitus without complications: Secondary | ICD-10-CM | POA: Diagnosis not present

## 2016-11-17 DIAGNOSIS — R739 Hyperglycemia, unspecified: Secondary | ICD-10-CM | POA: Diagnosis not present

## 2016-11-17 DIAGNOSIS — E890 Postprocedural hypothyroidism: Secondary | ICD-10-CM

## 2016-11-17 DIAGNOSIS — L03032 Cellulitis of left toe: Secondary | ICD-10-CM

## 2016-11-17 DIAGNOSIS — E876 Hypokalemia: Secondary | ICD-10-CM

## 2016-11-17 DIAGNOSIS — D649 Anemia, unspecified: Secondary | ICD-10-CM

## 2016-11-17 DIAGNOSIS — I10 Essential (primary) hypertension: Secondary | ICD-10-CM | POA: Diagnosis not present

## 2016-11-17 LAB — CBC WITH DIFFERENTIAL/PLATELET
BASOS PCT: 0.5 % (ref 0.0–3.0)
Basophils Absolute: 0 10*3/uL (ref 0.0–0.1)
EOS PCT: 1.2 % (ref 0.0–5.0)
Eosinophils Absolute: 0.1 10*3/uL (ref 0.0–0.7)
HEMATOCRIT: 31.1 % — AB (ref 36.0–46.0)
Hemoglobin: 10.6 g/dL — ABNORMAL LOW (ref 12.0–15.0)
LYMPHS ABS: 1.4 10*3/uL (ref 0.7–4.0)
LYMPHS PCT: 23.3 % (ref 12.0–46.0)
MCHC: 33.9 g/dL (ref 30.0–36.0)
MCV: 93.8 fl (ref 78.0–100.0)
MONOS PCT: 9.9 % (ref 3.0–12.0)
Monocytes Absolute: 0.6 10*3/uL (ref 0.1–1.0)
NEUTROS ABS: 3.9 10*3/uL (ref 1.4–7.7)
NEUTROS PCT: 65.1 % (ref 43.0–77.0)
PLATELETS: 300 10*3/uL (ref 150.0–400.0)
RBC: 3.32 Mil/uL — ABNORMAL LOW (ref 3.87–5.11)
RDW: 14.4 % (ref 11.5–15.5)
WBC: 6 10*3/uL (ref 4.0–10.5)

## 2016-11-17 LAB — BASIC METABOLIC PANEL
BUN: 15 mg/dL (ref 6–23)
CO2: 26 mEq/L (ref 19–32)
Calcium: 9.5 mg/dL (ref 8.4–10.5)
Chloride: 104 mEq/L (ref 96–112)
Creatinine, Ser: 0.86 mg/dL (ref 0.40–1.20)
GFR: 68.92 mL/min (ref >60.00)
GLUCOSE: 104 mg/dL — AB (ref 70–99)
Potassium: 3.7 mEq/L (ref 3.5–5.1)
SODIUM: 138 meq/L (ref 135–145)

## 2016-11-17 LAB — URINALYSIS, ROUTINE W REFLEX MICROSCOPIC
Bilirubin Urine: NEGATIVE
HGB URINE DIPSTICK: NEGATIVE
KETONES UR: NEGATIVE
LEUKOCYTES UA: NEGATIVE
NITRITE: NEGATIVE
SPECIFIC GRAVITY, URINE: 1.02 (ref 1.000–1.030)
Total Protein, Urine: NEGATIVE
UROBILINOGEN UA: 0.2 (ref 0.0–1.0)
Urine Glucose: NEGATIVE
pH: 6 (ref 5.0–8.0)

## 2016-11-17 LAB — LIPID PANEL
CHOLESTEROL: 179 mg/dL (ref 0–200)
HDL: 73.7 mg/dL (ref >39.00)
LDL Cholesterol: 91 mg/dL (ref 0–99)
NonHDL: 104.91
TRIGLYCERIDES: 72 mg/dL (ref 0.0–149.0)
Total CHOL/HDL Ratio: 2
VLDL: 14.4 mg/dL (ref 0.0–40.0)

## 2016-11-17 LAB — HEPATIC FUNCTION PANEL
ALBUMIN: 4.2 g/dL (ref 3.5–5.2)
ALT: 10 U/L (ref 0–35)
AST: 16 U/L (ref 0–37)
Alkaline Phosphatase: 29 U/L — ABNORMAL LOW (ref 39–117)
Bilirubin, Direct: 0.1 mg/dL (ref 0.0–0.3)
Total Bilirubin: 0.3 mg/dL (ref 0.2–1.2)
Total Protein: 6.9 g/dL (ref 6.0–8.3)

## 2016-11-17 LAB — POCT GLYCOSYLATED HEMOGLOBIN (HGB A1C): Hemoglobin A1C: 5.6

## 2016-11-17 LAB — TSH: TSH: 0.83 u[IU]/mL (ref 0.35–4.50)

## 2016-11-17 MED ORDER — CEPHALEXIN 500 MG PO CAPS
500.0000 mg | ORAL_CAPSULE | Freq: Three times a day (TID) | ORAL | 0 refills | Status: DC
Start: 2016-11-17 — End: 2016-11-18

## 2016-11-17 MED ORDER — LOSARTAN POTASSIUM-HCTZ 100-25 MG PO TABS: 1.0000 | ORAL_TABLET | Freq: Every day | ORAL | 3 refills | Status: DC

## 2016-11-17 MED ORDER — CARVEDILOL 3.125 MG PO TABS: 3.1250 mg | ORAL_TABLET | Freq: Two times a day (BID) | ORAL | 3 refills | Status: DC

## 2016-11-17 NOTE — Patient Instructions (Addendum)
I have sent a prescription to your pharmacy, for the antibiotic pill. I hope your toe gets better soon.  If it is not better by next week, please call back.  Please call sooner if you get worse.   Please continue the same pioglitizone.   Please come back for a regular physical appointment in 4 months.  Please consider these measures for your health:  minimize alcohol.  Do not use tobacco products.  Have a colonoscopy at least every 10 years from age 72.  Women should have an annual mammogram from age 52.  Keep firearms safely stored.  Always use seat belts.  have working smoke alarms in your home.  See an eye doctor and dentist regularly.  Never drive under the influence of alcohol or drugs (including prescription drugs).  Those with fair skin should take precautions against the sun, and should carefully examine their skin once per month, for any new or changed moles. It is critically important to prevent falling down (keep floor areas well-lit, dry, and free of loose objects.  If you have a cane, walker, or wheelchair, you should use it, even for short trips around the house.  Wear flat-soled shoes.  Also, try not to rush).  blood tests are requested for you today.  We'll let you know about the results. Please reduce the carvedilol.  I have sent a prescription to your pharmacy.

## 2016-11-17 NOTE — Telephone Encounter (Signed)
Patient scheduled for 01/26/2017 at 2 pm for her bone density. Patient advised via voicemail of appointment date.

## 2016-11-17 NOTE — Progress Notes (Signed)
Subjective:    Patient ID: [name], female    DOB: 05-22-1946, 71 y.o.   MRN: [id number]  HPI Pt returns for f/u of diabetes mellitus:  DM type: 2 Dx'ed: 8786 Complications: none Therapy: pioglitizone GDM: never DKA: never Severe hypoglycemia: never Pancreatitis: never.  Other: she has never been on insulin.  Interval history: pt states she feels well in general.  Past Medical History:  Diagnosis Date  . Arthritis   . Blood transfusion    for GI bleed 1987  . Diabetes mellitus   . Hypertension   . Thyroid disease     Past Surgical History:  Procedure Laterality Date  . BREAST BIOPSY    . rt knee arthroscopy    . THYROIDECTOMY, PARTIAL      Social History   Social History  . Marital status: Married    Spouse name: N/A  . Number of children: N/A  . Years of education: N/A   Occupational History  . Not on file.   Social History Main Topics  . Smoking status: Former Smoker    Quit date: 09/09/1986  . Smokeless tobacco: Never Used  . Alcohol use 7.0 oz/week    14 Standard drinks or equivalent per week  . Drug use: No  . Sexual activity: Not on file   Other Topics Concern  . Not on file   Social History Narrative  . No narrative on file    Current Outpatient Prescriptions on File Prior to Visit  Medication Sig Dispense Refill  . amLODipine (NORVASC) 2.5 MG tablet Take 1 tablet by mouth  daily 90 tablet 3  . cholecalciferol (VITAMIN D) 1000 UNITS tablet Take 1,000 Units by mouth daily.    . ferrous sulfate 325 (65 FE) MG tablet Take 325 mg by mouth daily with breakfast.    . ibuprofen (ADVIL,MOTRIN) 200 MG tablet 200 mg daily. 3 tablets every morning and 3 tablets every evening    . levothyroxine (SYNTHROID, LEVOTHROID) 50 MCG tablet Take 1 tablet by mouth  daily before breakfast 90 tablet 3  . lovastatin (MEVACOR) 40 MG tablet TAKE 1 TABLET BY MOUTH TWO  TIMES DAILY 180 tablet 3  . pioglitazone (ACTOS) 15 MG tablet TAKE 1 TABLET BY MOUTH  DAILY  90 tablet 3  . Potassium Chloride CR (MICRO-K) 8 MEQ CPCR capsule CR Take 2 capsules by mouth  two times daily 360 capsule 3  . raloxifene (EVISTA) 60 MG tablet Take 1 tablet by mouth  daily 90 tablet 3  . RESTASIS 0.05 % ophthalmic emulsion Place 1 drop into both eyes every 12 (twelve) hours.      No current facility-administered medications on file prior to visit.     Allergies  Allergen Reactions  . Erythromycin     REACTION: Rash  . Meperidine Hcl     REACTION: Nausea    Family History  Problem Relation Age of Onset  . Colon cancer Mother     BP 104/70   Pulse 71   Ht 5\' 3"  (1.6 m)   Wt 162 lb (73.5 kg)   SpO2 96%   BMI 28.70 kg/m    Review of Systems She has a few days of pain of the left great toe    Objective:   Physical Exam VITAL SIGNS:  See vs page GENERAL: no distress Pulses: dorsalis pedis intact bilat.   MSK: no deformity of the feet CV: no leg edema Skin:  no ulcer on the feet.  normal color and temp on the feet. Neuro: sensation is intact to touch on the feet Ext: at he paronychial area of the left great toe, there is slight erythema and swelling.  No drainage.   I have reviewed outside records, and summarized: Pt recently had right TKR  A1c=5.6%    Assessment & Plan:  Paronychia, new.  I rx'ed antibiotic, especially in view of recent Type 2 DM: well-controlled.  Please continue the same medication.  Patient here for Medicare annual wellness visit and management of other chronic and acute problems.     Risk factors: advanced age    50 of Physicians Providing Medical Care to Patient:  See "snapshot"   Activities of Daily Living: In your present state of health, do you have any difficulty performing the following activities (lives with husband)?:  Preparing food and eating?: No  Bathing yourself: No  Getting dressed: No  Using the toilet:No  Moving around from place to place: No  In the past year have you fallen or had a near fall?:  No    Home Safety: Has smoke detector and wears seat belts. Firearms are safely stored. No excess sun exposure.    Diet and Exercise  Current exercise habits: pt says improved since surgery.  Dietary issues discussed: pt reports a healthy diet.    Depression Screen  Q1: Over the past two weeks, have you felt down, depressed or hopeless? no  Q2: Over the past two weeks, have you felt little interest or pleasure in doing things? no   The following portions of the patient's history were reviewed and updated as appropriate: allergies, current medications, past family history, past medical history, past social history, past surgical history and problem list.   Review of Systems  No change in hearing or visual loss.  Objective:   Vision:  Advertising account executive, so she declines VA today.  Hearing: grossly normal.  Body mass index:  See vs page.  Msk: pt easily and quickly performs "get-up-and-go" from a sitting position.  Cognitive Impairment Assessment: cognition, memory and judgment appear normal.   remembers 3/3 at 5 minutes.  excellent recall.  can easily read and write a sentence.  alert and oriented x 3.     Assessment:   Medicare wellness utd on preventive parameters    Plan:   During the course of the visit the patient was educated and counseled about appropriate screening.  and preventive services including:        Fall prevention   Screening mammography is UTD Bone densitometry screening is scheduled today Diabetes screening  Nutrition counseling   Vaccines / LABS Zostavax / Pneumococcal Vaccine  today   Patient Instructions (the written plan) was given to the patient.

## 2016-11-17 NOTE — Telephone Encounter (Signed)
[facility] calling to speak to [facility], returning call to schedule patient.

## 2016-11-18 ENCOUNTER — Telehealth: Payer: Self-pay | Admitting: Endocrinology

## 2016-11-18 ENCOUNTER — Other Ambulatory Visit (INDEPENDENT_AMBULATORY_CARE_PROVIDER_SITE_OTHER): Payer: Medicare Other

## 2016-11-18 DIAGNOSIS — D649 Anemia, unspecified: Secondary | ICD-10-CM

## 2016-11-18 LAB — IBC PANEL
IRON: 73 ug/dL (ref 42–145)
Saturation Ratios: 21.2 % (ref 20.0–50.0)
TRANSFERRIN: 246 mg/dL (ref 212.0–360.0)

## 2016-11-18 MED ORDER — CEPHALEXIN 500 MG PO CAPS: 500.0000 mg | ORAL_CAPSULE | Freq: Three times a day (TID) | ORAL | 0 refills | Status: DC

## 2016-11-18 NOTE — Telephone Encounter (Signed)
I contacted the patient and advised the rx for the antibiotic has been resubmitted to the pharmacy.

## 2016-11-18 NOTE — Telephone Encounter (Signed)
Patient states she was supposed to have antibiotic sent to pharmacy after visit yesterday. Patient states she is leaving town tomorrow and needs it before then.

## 2016-12-24 ENCOUNTER — Other Ambulatory Visit: Payer: Self-pay | Admitting: Endocrinology

## 2017-01-08 NOTE — Telephone Encounter (Signed)
error 

## 2017-01-13 DIAGNOSIS — Z96651 Presence of right artificial knee joint: Secondary | ICD-10-CM | POA: Diagnosis not present

## 2017-01-13 DIAGNOSIS — Z87891 Personal history of nicotine dependence: Secondary | ICD-10-CM | POA: Diagnosis not present

## 2017-01-13 DIAGNOSIS — Z471 Aftercare following joint replacement surgery: Secondary | ICD-10-CM | POA: Diagnosis not present

## 2017-01-26 ENCOUNTER — Inpatient Hospital Stay: Admission: RE | Admit: 2017-01-26 | Payer: Self-pay | Source: Ambulatory Visit

## 2017-03-18 ENCOUNTER — Other Ambulatory Visit: Payer: Self-pay | Admitting: Endocrinology

## 2017-04-05 DIAGNOSIS — Z96651 Presence of right artificial knee joint: Secondary | ICD-10-CM | POA: Diagnosis not present

## 2017-04-05 DIAGNOSIS — Z471 Aftercare following joint replacement surgery: Secondary | ICD-10-CM | POA: Diagnosis not present

## 2017-04-12 ENCOUNTER — Ambulatory Visit (INDEPENDENT_AMBULATORY_CARE_PROVIDER_SITE_OTHER): Payer: Medicare Other | Admitting: Endocrinology

## 2017-04-12 ENCOUNTER — Encounter: Payer: Self-pay | Admitting: Endocrinology

## 2017-04-12 VITALS — BP 114/60 | HR 61 | Temp 98.1°F | Resp 16 | Ht 62.5 in | Wt 150.4 lb

## 2017-04-12 DIAGNOSIS — Z23 Encounter for immunization: Secondary | ICD-10-CM | POA: Diagnosis not present

## 2017-04-12 DIAGNOSIS — R739 Hyperglycemia, unspecified: Secondary | ICD-10-CM | POA: Diagnosis not present

## 2017-04-12 DIAGNOSIS — D649 Anemia, unspecified: Secondary | ICD-10-CM

## 2017-04-12 DIAGNOSIS — Z780 Asymptomatic menopausal state: Secondary | ICD-10-CM | POA: Diagnosis not present

## 2017-04-12 LAB — CBC WITH DIFFERENTIAL/PLATELET
BASOS ABS: 0 10*3/uL (ref 0.0–0.1)
BASOS PCT: 0.2 % (ref 0.0–3.0)
EOS ABS: 0 10*3/uL (ref 0.0–0.7)
Eosinophils Relative: 0.8 % (ref 0.0–5.0)
HEMATOCRIT: 32.5 % — AB (ref 36.0–46.0)
HEMOGLOBIN: 10.8 g/dL — AB (ref 12.0–15.0)
LYMPHS PCT: 21.6 % (ref 12.0–46.0)
Lymphs Abs: 1.2 10*3/uL (ref 0.7–4.0)
MCHC: 33.4 g/dL (ref 30.0–36.0)
MCV: 93.7 fl (ref 78.0–100.0)
MONO ABS: 0.4 10*3/uL (ref 0.1–1.0)
Monocytes Relative: 7 % (ref 3.0–12.0)
Neutro Abs: 3.8 10*3/uL (ref 1.4–7.7)
Neutrophils Relative %: 70.4 % (ref 43.0–77.0)
Platelets: 283 10*3/uL (ref 150.0–400.0)
RBC: 3.46 Mil/uL — ABNORMAL LOW (ref 3.87–5.11)
RDW: 15 % (ref 11.5–15.5)
WBC: 5.4 10*3/uL (ref 4.0–10.5)

## 2017-04-12 LAB — POCT GLYCOSYLATED HEMOGLOBIN (HGB A1C): HEMOGLOBIN A1C: 5.3

## 2017-04-12 LAB — IBC PANEL
IRON: 67 ug/dL (ref 42–145)
SATURATION RATIOS: 19.8 % — AB (ref 20.0–50.0)
TRANSFERRIN: 242 mg/dL (ref 212.0–360.0)

## 2017-04-12 MED ORDER — CARVEDILOL 3.125 MG PO TABS: 1.5625 mg | ORAL_TABLET | Freq: Two times a day (BID) | ORAL | 3 refills | Status: DC

## 2017-04-12 NOTE — Progress Notes (Signed)
Subjective:    Patient ID: [name], female    DOB: 1945/07/23, 71 y.o.   MRN: [id number]  HPI Pt is here for regular wellness examination, and is feeling pretty well in general, and says chronic med probs are stable, except as noted below She takes fe 1/d.  Past Medical History:  Diagnosis Date  . Arthritis   . Blood transfusion    for GI bleed 1987  . Diabetes mellitus   . Hypertension   . Thyroid disease     Past Surgical History:  Procedure Laterality Date  . BREAST BIOPSY    . rt knee arthroscopy    . THYROIDECTOMY, PARTIAL      Social History   Social History  . Marital status: Married    Spouse name: N/A  . Number of children: N/A  . Years of education: N/A   Occupational History  . Not on file.   Social History Main Topics  . Smoking status: Former Smoker    Quit date: 09/09/1986  . Smokeless tobacco: Never Used  . Alcohol use 7.0 oz/week    14 Standard drinks or equivalent per week  . Drug use: No  . Sexual activity: Not on file   Other Topics Concern  . Not on file   Social History Narrative  . No narrative on file    Current Outpatient Prescriptions on File Prior to Visit  Medication Sig Dispense Refill  . amLODipine (NORVASC) 2.5 MG tablet TAKE 1 TABLET BY MOUTH  DAILY 90 tablet 3  . cholecalciferol (VITAMIN D) 1000 UNITS tablet Take 1,000 Units by mouth daily.    . ferrous sulfate 325 (65 FE) MG tablet Take 325 mg by mouth daily with breakfast.    . ibuprofen (ADVIL,MOTRIN) 200 MG tablet 200 mg daily. 3 tablets every morning and 3 tablets every evening    . levothyroxine (SYNTHROID, LEVOTHROID) 50 MCG tablet TAKE 1 TABLET BY MOUTH  DAILY BEFORE BREAKFAST 90 tablet 3  . losartan-hydrochlorothiazide (HYZAAR) 100-25 MG tablet Take 1 tablet by mouth daily. 90 tablet 3  . lovastatin (MEVACOR) 40 MG tablet TAKE 1 TABLET BY MOUTH TWO  TIMES DAILY 180 tablet 3  . pioglitazone (ACTOS) 15 MG tablet TAKE 1 TABLET BY MOUTH  DAILY 90 tablet 1  .  Potassium Chloride CR (MICRO-K) 8 MEQ CPCR capsule CR Take 2 capsules by mouth  two times daily 360 capsule 3  . raloxifene (EVISTA) 60 MG tablet TAKE 1 TABLET BY MOUTH  DAILY 90 tablet 3  . RESTASIS 0.05 % ophthalmic emulsion Place 1 drop into both eyes every 12 (twelve) hours.      No current facility-administered medications on file prior to visit.     Allergies  Allergen Reactions  . Erythromycin     REACTION: Rash  . Meperidine Hcl     REACTION: Nausea    Family History  Problem Relation Age of Onset  . Colon cancer Mother     BP 114/60 (BP Location: Left Arm, Patient Position: Sitting, Cuff Size: Normal)   Pulse 61   Temp 98.1 F (36.7 C) (Oral)   Resp 16   Ht 5' 2.5" (1.588 m)   Wt 150 lb 6 oz (68.2 kg)   SpO2 96%   BMI 27.07 kg/m    Review of Systems Denies fever, fatigue, visual loss, hearing loss, chest pain, sob, back pain, depression, cold intolerance, BRBPR, hematuria, syncope, numbness, allergy sxs, easy bruising, and rash.  She has lost  a few lbs, due to her efforts.       Objective:   Physical Exam VS: see vs page GEN: no distress HEAD: head: no deformity eyes: no periorbital swelling, no proptosis external nose and ears are normal mouth: no lesion seen NECK: supple, thyroid is not enlarged CHEST WALL: no deformity LUNGS:  Clear to auscultation CV: reg rate and rhythm, no murmur.   ABD: abdomen is soft, nontender.  no hepatosplenomegaly.  not distended.  no hernia.  MUSCULOSKELETAL: muscle bulk and strength are grossly normal.  no obvious joint swelling.  gait is normal and steady EXTEMITIES: no deformity.  no ulcer on the feet.  feet are of normal color and temp.  no edema PULSES: dorsalis pedis intact bilat.  no carotid bruit NEURO:  cn 2-12 grossly intact.   readily moves all 4's.  sensation is intact to touch on the feet SKIN:  Normal texture and temperature.  No rash or suspicious lesion is visible.   NODES:  None palpable at the neck.     PSYCH: alert, well-oriented.  Does not appear anxious nor depressed.     ecg machine is not working today.     Lab Results  Component Value Date   HGBA1C 5.3 04/12/2017   Lab Results  Component Value Date   WBC 5.4 04/12/2017   HGB 10.8 (L) 04/12/2017   HCT 32.5 (L) 04/12/2017   MCV 93.7 04/12/2017   PLT 283.0 04/12/2017       Assessment & Plan:  Wellness visit today, with problems stable, except as noted. fe-deficiency anemia: persistent: she needs increased rx.  Patient Instructions  blood tests are requested for you today.  We'll let you know about the results.   I have sent a prescription to your pharmacy, to reduce the carvedilol.   Please consider these measures for your health:  minimize alcohol.  Do not use tobacco products.  Have a colonoscopy at least every 10 years from age 65.  Women should have an annual mammogram from age 73.  Keep firearms safely stored.  Always use seat belts.  have working smoke alarms in your home.  See an eye doctor and dentist regularly.  Never drive under the influence of alcohol or drugs (including prescription drugs).  Those with fair skin should take precautions against the sun, and should carefully examine their skin once per month, for any new or changed moles. It is critically important to prevent falling down (keep floor areas well-lit, dry, and free of loose objects.  If you have a cane, walker, or wheelchair, you should use it, even for short trips around the house.  Wear flat-soled shoes.  Also, try not to rush.   Please come back for a follow-up appointment in 8 months (after 11/17/17).

## 2017-04-12 NOTE — Patient Instructions (Addendum)
blood tests are requested for you today.  We'll let you know about the results.   I have sent a prescription to your pharmacy, to reduce the carvedilol.   Please consider these measures for your health:  minimize alcohol.  Do not use tobacco products.  Have a colonoscopy at least every 10 years from age 72.  Women should have an annual mammogram from age 31.  Keep firearms safely stored.  Always use seat belts.  have working smoke alarms in your home.  See an eye doctor and dentist regularly.  Never drive under the influence of alcohol or drugs (including prescription drugs).  Those with fair skin should take precautions against the sun, and should carefully examine their skin once per month, for any new or changed moles. It is critically important to prevent falling down (keep floor areas well-lit, dry, and free of loose objects.  If you have a cane, walker, or wheelchair, you should use it, even for short trips around the house.  Wear flat-soled shoes.  Also, try not to rush.   Please come back for a follow-up appointment in 8 months (after 11/17/17).

## 2017-04-14 ENCOUNTER — Ambulatory Visit (INDEPENDENT_AMBULATORY_CARE_PROVIDER_SITE_OTHER): Payer: Medicare Other

## 2017-04-14 DIAGNOSIS — Z780 Asymptomatic menopausal state: Secondary | ICD-10-CM | POA: Diagnosis not present

## 2017-04-14 DIAGNOSIS — M85832 Other specified disorders of bone density and structure, left forearm: Secondary | ICD-10-CM | POA: Diagnosis not present

## 2017-04-19 DIAGNOSIS — H04123 Dry eye syndrome of bilateral lacrimal glands: Secondary | ICD-10-CM | POA: Diagnosis not present

## 2017-04-19 DIAGNOSIS — Z961 Presence of intraocular lens: Secondary | ICD-10-CM | POA: Diagnosis not present

## 2017-04-19 DIAGNOSIS — H52203 Unspecified astigmatism, bilateral: Secondary | ICD-10-CM | POA: Diagnosis not present

## 2017-04-19 DIAGNOSIS — E119 Type 2 diabetes mellitus without complications: Secondary | ICD-10-CM | POA: Diagnosis not present

## 2017-04-19 DIAGNOSIS — H524 Presbyopia: Secondary | ICD-10-CM | POA: Diagnosis not present

## 2017-04-29 ENCOUNTER — Encounter: Payer: Self-pay | Admitting: Endocrinology

## 2017-05-30 ENCOUNTER — Other Ambulatory Visit: Payer: Self-pay | Admitting: Endocrinology

## 2017-07-05 DIAGNOSIS — Z96651 Presence of right artificial knee joint: Secondary | ICD-10-CM | POA: Diagnosis not present

## 2017-07-05 DIAGNOSIS — Z471 Aftercare following joint replacement surgery: Secondary | ICD-10-CM | POA: Diagnosis not present

## 2017-07-05 DIAGNOSIS — M25461 Effusion, right knee: Secondary | ICD-10-CM | POA: Diagnosis not present

## 2017-07-14 ENCOUNTER — Encounter: Payer: Self-pay | Admitting: Endocrinology

## 2017-07-19 ENCOUNTER — Other Ambulatory Visit: Payer: Self-pay | Admitting: Endocrinology

## 2017-08-26 ENCOUNTER — Other Ambulatory Visit: Payer: Self-pay | Admitting: Endocrinology

## 2017-10-11 DIAGNOSIS — M1711 Unilateral primary osteoarthritis, right knee: Secondary | ICD-10-CM | POA: Diagnosis not present

## 2017-10-11 DIAGNOSIS — Z471 Aftercare following joint replacement surgery: Secondary | ICD-10-CM | POA: Diagnosis not present

## 2017-10-11 DIAGNOSIS — Z96651 Presence of right artificial knee joint: Secondary | ICD-10-CM | POA: Diagnosis not present

## 2017-10-14 ENCOUNTER — Other Ambulatory Visit: Payer: Self-pay | Admitting: Endocrinology

## 2017-12-22 ENCOUNTER — Other Ambulatory Visit: Payer: Self-pay | Admitting: Endocrinology

## 2018-01-10 ENCOUNTER — Ambulatory Visit: Payer: Self-pay | Admitting: Endocrinology

## 2018-01-12 ENCOUNTER — Ambulatory Visit: Payer: Medicare Other | Admitting: Endocrinology

## 2018-01-27 ENCOUNTER — Telehealth: Payer: Self-pay | Admitting: Endocrinology

## 2018-01-27 NOTE — Telephone Encounter (Signed)
10:45 am, 02/08/18

## 2018-01-27 NOTE — Telephone Encounter (Signed)
Best number [phone]  Pt called to schedule cpx with dr Loanne Drilling.  Pt stated they have been out of state due to parents illness. Pt father passed away and wanted to know if she and her spouse could be worked in between 01/31/18 through 02/09/18. If cannot be worked in out be out of town for several months

## 2018-01-27 NOTE — Telephone Encounter (Signed)
Please advise on below  

## 2018-01-28 NOTE — Telephone Encounter (Signed)
Pt called back to schedule and I didn't see slots available. Pt's requesting call back to get scheduled.

## 2018-01-28 NOTE — Telephone Encounter (Signed)
I have spoken with patient & she needs to be schedule per Dr. Loanne Drilling 8/21 @ 9:45 am. Her husband Gwyndolyn Saxon #[phone] needs to be scheduled 8/20 @ 10 am. I will have Colletta [place] schedule.

## 2018-01-28 NOTE — Telephone Encounter (Signed)
completed

## 2018-01-28 NOTE — Telephone Encounter (Signed)
I called patient to see if she could come in for the 8/20 appointment & to let her know there was also a cancellation for 8/13 at 8:15. I asked that she call back ASAP so we can her husband on the schedule.  

## 2018-01-31 ENCOUNTER — Other Ambulatory Visit: Payer: Self-pay | Admitting: Endocrinology

## 2018-01-31 DIAGNOSIS — Z1231 Encounter for screening mammogram for malignant neoplasm of breast: Secondary | ICD-10-CM

## 2018-02-02 ENCOUNTER — Ambulatory Visit (INDEPENDENT_AMBULATORY_CARE_PROVIDER_SITE_OTHER): Payer: Medicare Other

## 2018-02-02 DIAGNOSIS — Z1231 Encounter for screening mammogram for malignant neoplasm of breast: Secondary | ICD-10-CM | POA: Diagnosis not present

## 2018-02-09 ENCOUNTER — Ambulatory Visit (INDEPENDENT_AMBULATORY_CARE_PROVIDER_SITE_OTHER): Payer: Medicare Other | Admitting: Endocrinology

## 2018-02-09 ENCOUNTER — Encounter: Payer: Self-pay | Admitting: Endocrinology

## 2018-02-09 VITALS — BP 132/76 | HR 66 | Ht 62.5 in | Wt 148.6 lb

## 2018-02-09 DIAGNOSIS — D649 Anemia, unspecified: Secondary | ICD-10-CM | POA: Diagnosis not present

## 2018-02-09 DIAGNOSIS — R739 Hyperglycemia, unspecified: Secondary | ICD-10-CM | POA: Diagnosis not present

## 2018-02-09 DIAGNOSIS — I10 Essential (primary) hypertension: Secondary | ICD-10-CM

## 2018-02-09 DIAGNOSIS — E890 Postprocedural hypothyroidism: Secondary | ICD-10-CM

## 2018-02-09 DIAGNOSIS — Z0000 Encounter for general adult medical examination without abnormal findings: Secondary | ICD-10-CM | POA: Diagnosis not present

## 2018-02-09 LAB — CBC WITH DIFFERENTIAL/PLATELET
BASOS PCT: 0.4 % (ref 0.0–3.0)
Basophils Absolute: 0 10*3/uL (ref 0.0–0.1)
EOS ABS: 0 10*3/uL (ref 0.0–0.7)
Eosinophils Relative: 0.7 % (ref 0.0–5.0)
HEMATOCRIT: 33.8 % — AB (ref 36.0–46.0)
Hemoglobin: 11.3 g/dL — ABNORMAL LOW (ref 12.0–15.0)
LYMPHS ABS: 1.1 10*3/uL (ref 0.7–4.0)
LYMPHS PCT: 22.9 % (ref 12.0–46.0)
MCHC: 33.3 g/dL (ref 30.0–36.0)
MCV: 94.6 fl (ref 78.0–100.0)
MONOS PCT: 10.2 % (ref 3.0–12.0)
Monocytes Absolute: 0.5 10*3/uL (ref 0.1–1.0)
NEUTROS ABS: 3.3 10*3/uL (ref 1.4–7.7)
NEUTROS PCT: 65.8 % (ref 43.0–77.0)
Platelets: 299 10*3/uL (ref 150.0–400.0)
RBC: 3.57 Mil/uL — ABNORMAL LOW (ref 3.87–5.11)
RDW: 15.5 % (ref 11.5–15.5)
WBC: 4.9 10*3/uL (ref 4.0–10.5)

## 2018-02-09 LAB — TSH: TSH: 1.57 u[IU]/mL (ref 0.35–4.50)

## 2018-02-09 LAB — IBC PANEL
IRON: 62 ug/dL (ref 42–145)
Saturation Ratios: 16.5 % — ABNORMAL LOW (ref 20.0–50.0)
Transferrin: 269 mg/dL (ref 212.0–360.0)

## 2018-02-09 LAB — HEMOGLOBIN A1C: Hgb A1c MFr Bld: 5.8 % (ref 4.6–6.5)

## 2018-02-09 NOTE — Progress Notes (Signed)
we discussed code status.  pt requests full code, but would not want to be started or maintained on artificial life-support measures if there was not a reasonable chance of recovery 

## 2018-02-09 NOTE — Patient Instructions (Signed)
blood tests are requested for you today.  We'll let you know about the results. good diet and exercise significantly improve the control of your diabetes.  please let me know if you wish to be referred to a dietician.  high blood sugar is very risky to your health.  you should see an eye doctor and dentist every year.  It is very important to get all recommended vaccinations.  Please consider these measures for your health:  minimize alcohol.  Do not use tobacco products.  Have a colonoscopy at least every 10 years from age 71.  Women should have an annual mammogram from age 47.  Keep firearms safely stored.  Always use seat belts.  have working smoke alarms in your home.  See an eye doctor and dentist regularly.  Never drive under the influence of alcohol or drugs (including prescription drugs).  Those with fair skin should take precautions against the sun, and should carefully examine their skin once per month, for any new or changed moles. please let me know what your wishes would be, if artificial life support measures should become necessary.  It is critically important to prevent falling down (keep floor areas well-lit, dry, and free of loose objects.  If you have a cane, walker, or wheelchair, you should use it, even for short trips around the house.  Wear flat-soled shoes.  Also, try not to rush)

## 2018-02-09 NOTE — Progress Notes (Signed)
Subjective:    Patient ID: [name], female    DOB: 1946-02-15, 72 y.o.   MRN: [id number]  HPI  The state of at least three ongoing medical problems is addressed today, with interval history of each noted here: Anemia: she takes fe 1-BID.  Denies BRBPR. This is a stable problem. hypothyroidism: she denies leg edema. This is a stable problem. HTN: she denies sob. This is a stable problem. Past Medical History:  Diagnosis Date  . Arthritis   . Blood transfusion    for GI bleed 1987  . Diabetes mellitus   . Hypertension   . Thyroid disease     Past Surgical History:  Procedure Laterality Date  . BREAST BIOPSY    . rt knee arthroscopy    . THYROIDECTOMY, PARTIAL      Social History   Socioeconomic History  . Marital status: Married    Spouse name: Not on file  . Number of children: Not on file  . Years of education: Not on file  . Highest education level: Not on file  Occupational History  . Not on file  Social Needs  . Financial resource strain: Not on file  . Food insecurity:    Worry: Not on file    Inability: Not on file  . Transportation needs:    Medical: Not on file    Non-medical: Not on file  Tobacco Use  . Smoking status: Former Smoker    Last attempt to quit: 09/09/1986    Years since quitting: 31.4  . Smokeless tobacco: Never Used  Substance and Sexual Activity  . Alcohol use: Yes    Alcohol/week: 14.0 standard drinks    Types: 14 Standard drinks or equivalent per week  . Drug use: No  . Sexual activity: Not on file  Lifestyle  . Physical activity:    Days per week: Not on file    Minutes per session: Not on file  . Stress: Not on file  Relationships  . Social connections:    Talks on phone: Not on file    Gets together: Not on file    Attends religious service: Not on file    Active member of club or organization: Not on file    Attends meetings of clubs or organizations: Not on file    Relationship status: Not on file  . Intimate  partner violence:    Fear of current or ex partner: Not on file    Emotionally abused: Not on file    Physically abused: Not on file    Forced sexual activity: Not on file  Other Topics Concern  . Not on file  Social History Narrative  . Not on file    Current Outpatient Medications on File Prior to Visit  Medication Sig Dispense Refill  . amLODipine (NORVASC) 2.5 MG tablet TAKE 1 TABLET BY MOUTH  DAILY 90 tablet 3  . carvedilol (COREG) 3.125 MG tablet Take 0.5 tablets (1.5625 mg total) by mouth 2 (two) times daily with a meal. 90 tablet 3  . cholecalciferol (VITAMIN D) 1000 UNITS tablet Take 1,000 Units by mouth daily.    . ferrous sulfate 325 (65 FE) MG tablet Take 325 mg by mouth daily with breakfast.    . ibuprofen (ADVIL,MOTRIN) 200 MG tablet 200 mg daily. 3 tablets every morning and 3 tablets every evening    . levothyroxine (SYNTHROID, LEVOTHROID) 50 MCG tablet TAKE 1 TABLET BY MOUTH  DAILY BEFORE BREAKFAST 90 tablet 3  .  losartan-hydrochlorothiazide (HYZAAR) 100-25 MG tablet Take 1 tablet by mouth daily. 90 tablet 3  . lovastatin (MEVACOR) 40 MG tablet TAKE 1 TABLET BY MOUTH TWO  TIMES DAILY 180 tablet 3  . pioglitazone (ACTOS) 15 MG tablet TAKE 1 TABLET BY MOUTH  DAILY 90 tablet 1  . Potassium Chloride CR (MICRO-K) 8 MEQ CPCR capsule CR TAKE 2 CAPSULES BY MOUTH  TWO TIMES DAILY 360 capsule 3  . raloxifene (EVISTA) 60 MG tablet TAKE 1 TABLET BY MOUTH  DAILY 90 tablet 3  . RESTASIS 0.05 % ophthalmic emulsion Place 1 drop into both eyes every 12 (twelve) hours.      No current facility-administered medications on file prior to visit.     Allergies  Allergen Reactions  . Erythromycin     REACTION: Rash  . Meperidine Hcl     REACTION: Nausea    Family History  Problem Relation Age of Onset  . Colon cancer Mother     BP 132/76   Pulse 66   Ht 5' 2.5" (1.588 m)   Wt 148 lb 9.6 oz (67.4 kg)   SpO2 97%   BMI 26.75 kg/m   Review of Systems Denies hematuria and chest  pain    Objective:   Physical Exam VITAL SIGNS:  See vs page GENERAL: no distress LUNGS:  Clear to auscultation HEART:  Regular rate and rhythm without murmurs noted. Normal S1,S2.   Ext: no leg edema.     Lab Results  Component Value Date   TSH 1.57 02/09/2018   Lab Results  Component Value Date   WBC 4.9 02/09/2018   HGB 11.3 (L) 02/09/2018   HCT 33.8 (L) 02/09/2018   MCV 94.6 02/09/2018   PLT 299.0 02/09/2018      Assessment & Plan:  Anemia: persistent.  Please continue the same iron hypothyroidism: well-controlled.  Please continue the same medication HTN: well-controlled.  Please continue the same medications  Subjective:   Patient here for Medicare annual wellness visit and management of other chronic and acute problems.     Risk factors: advanced age    55 of Physicians Providing Medical Care to Patient:  See "snapshot"   Activities of Daily Living: In your present state of health, do you have any difficulty performing the following activities (lives with husband)?:  Preparing food and eating?: No  Bathing yourself: No  Getting dressed: No  Using the toilet:No  Moving around from place to place: No  In the past year have you fallen or had a near fall?: No    Home Safety: Has smoke detector and wears seat belts. Firearms are safely stored. No excess sun exposure.  Opioid Use: none   Diet and Exercise  Current exercise habits: pt says fair Dietary issues discussed: pt reports a healthy diet   Depression Screen  Q1: Over the past two weeks, have you felt down, depressed or hopeless?no  Q2: Over the past two weeks, have you felt little interest or pleasure in doing things? no   The following portions of the patient's history were reviewed and updated as appropriate: allergies, current medications, past family history, past medical history, past social history, past surgical history and problem list.   Review of Systems  No change in chronic hearing  or visual loss.   Objective:   Vision:  See [facility] done today Hearing: grossly normal Body mass index:  See vs page Msk: pt easily and quickly performs "get-up-and-go" from a sitting position Cognitive Impairment Assessment:  cognition, memory and judgment appear normal.  remembers 3/3 at 5 minutes.  excellent recall.  can easily read and write a sentence.  alert and oriented x 3.     Assessment:   Medicare wellness utd on preventive parameters    Plan:   During the course of the visit the patient was educated and counseled about appropriate screening and preventive services including:        Fall prevention is advised today  Screening mammography is UTD Bone densitometry screening is UTD Diabetes screening is done today Nutrition counseling is offered  advanced directives/end of life addressed today:  see healthcare directives hyperlink  Vaccines are updated as needed  Patient Instructions (the written plan) was given to the patient.

## 2018-03-02 ENCOUNTER — Other Ambulatory Visit: Payer: Self-pay | Admitting: Endocrinology

## 2018-04-12 ENCOUNTER — Encounter: Payer: Self-pay | Admitting: Endocrinology

## 2018-04-14 ENCOUNTER — Other Ambulatory Visit: Payer: Self-pay

## 2018-04-14 ENCOUNTER — Other Ambulatory Visit: Payer: Self-pay | Admitting: Endocrinology

## 2018-04-14 NOTE — Patient Outreach (Signed)
[facility]) Care Management  04/14/2018  [name] 1945/11/29 [id number]   Medication Adherence call to Mrs. [name] left a message for patient to call back patient is due on Lovastatin 40 mg. Mrs. [name] is showing past due under [place].   [facility] Management Direct Dial [phone]  Fax [phone] [name]

## 2018-04-29 DIAGNOSIS — H5213 Myopia, bilateral: Secondary | ICD-10-CM | POA: Diagnosis not present

## 2018-04-29 DIAGNOSIS — H524 Presbyopia: Secondary | ICD-10-CM | POA: Diagnosis not present

## 2018-04-29 DIAGNOSIS — E119 Type 2 diabetes mellitus without complications: Secondary | ICD-10-CM | POA: Diagnosis not present

## 2018-04-29 DIAGNOSIS — H52203 Unspecified astigmatism, bilateral: Secondary | ICD-10-CM | POA: Diagnosis not present

## 2018-04-29 DIAGNOSIS — Z961 Presence of intraocular lens: Secondary | ICD-10-CM | POA: Diagnosis not present

## 2018-05-03 ENCOUNTER — Ambulatory Visit (INDEPENDENT_AMBULATORY_CARE_PROVIDER_SITE_OTHER): Payer: Medicare Other

## 2018-05-03 ENCOUNTER — Ambulatory Visit (INDEPENDENT_AMBULATORY_CARE_PROVIDER_SITE_OTHER): Payer: Medicare Other | Admitting: Osteopathic Medicine

## 2018-05-03 ENCOUNTER — Encounter: Payer: Self-pay | Admitting: Osteopathic Medicine

## 2018-05-03 VITALS — BP 143/81 | HR 75 | Temp 98.1°F | Ht 63.0 in | Wt 151.4 lb

## 2018-05-03 DIAGNOSIS — E782 Mixed hyperlipidemia: Secondary | ICD-10-CM | POA: Diagnosis not present

## 2018-05-03 DIAGNOSIS — E890 Postprocedural hypothyroidism: Secondary | ICD-10-CM

## 2018-05-03 DIAGNOSIS — Z1331 Encounter for screening for depression: Secondary | ICD-10-CM

## 2018-05-03 DIAGNOSIS — M48061 Spinal stenosis, lumbar region without neurogenic claudication: Secondary | ICD-10-CM

## 2018-05-03 DIAGNOSIS — I10 Essential (primary) hypertension: Secondary | ICD-10-CM | POA: Diagnosis not present

## 2018-05-03 DIAGNOSIS — D649 Anemia, unspecified: Secondary | ICD-10-CM

## 2018-05-03 DIAGNOSIS — Z96651 Presence of right artificial knee joint: Secondary | ICD-10-CM

## 2018-05-03 DIAGNOSIS — M25551 Pain in right hip: Secondary | ICD-10-CM

## 2018-05-03 DIAGNOSIS — M545 Low back pain: Secondary | ICD-10-CM

## 2018-05-03 DIAGNOSIS — M1990 Unspecified osteoarthritis, unspecified site: Secondary | ICD-10-CM

## 2018-05-03 DIAGNOSIS — M8580 Other specified disorders of bone density and structure, unspecified site: Secondary | ICD-10-CM

## 2018-05-03 DIAGNOSIS — M79604 Pain in right leg: Secondary | ICD-10-CM

## 2018-05-03 MED ORDER — ZOSTER VAC RECOMB ADJUVANTED 50 MCG/0.5ML IM SUSR: 0.5000 mL | Freq: Once | INTRAMUSCULAR | 0 refills | Status: AC

## 2018-05-03 MED ORDER — MELOXICAM 7.5 MG PO TABS: 7.5000 mg | ORAL_TABLET | Freq: Every day | ORAL | 0 refills | Status: DC

## 2018-05-03 MED ORDER — CYCLOBENZAPRINE HCL 5 MG PO TABS: 5.0000 mg | ORAL_TABLET | Freq: Three times a day (TID) | ORAL | 1 refills | Status: DC | PRN

## 2018-05-03 MED ORDER — PREDNISONE 20 MG PO TABS: 20.0000 mg | ORAL_TABLET | Freq: Two times a day (BID) | ORAL | 0 refills | Status: DC

## 2018-05-03 NOTE — Patient Instructions (Addendum)
Plan:  Given current stressors, I am not too worried about the blood pressure being a bit borderline.  We will plan to recheck this when I see you again in 3 months  OK to stop the Actos, let's follow-up on the A1c in 3 months  For hip pain, I suspect likely hip arthritis complicated by a trochanteric bursitis.  We will get x-rays today, can try ibuprofen for now and can switch to Mobic/meloxicam when you get this through mail order.  We will also trial steroid burst and you can take muscle relaxer cyclobenzaprine as needed  Prescription printed for shingles vaccine, can take to the pharmacy to have this administered.

## 2018-05-03 NOTE — Progress Notes (Signed)
HPI: [name] is a 72 y.o. female who  has a past medical history of Arthritis, Blood transfusion, Diabetes mellitus, Hypertension, and Thyroid disease.  she presents to [facility] today, 05/03/18,  for chief complaint of: New to establish care See headings below   HTN BP a bit elevated today, (+)stress No CP/SOB, no HA/VC/dizzy  Hypothyroidism Had been following with Dr Loanne Drilling - endocrine Last TSH ok  Arthritis Following with Ortho, Dr Robby Sermon  History R knee replacement History lumbar spine arthritis and reported stenosis -lumbar MRI report available for review from 10/12/2012 shows multilevel DDD worse at L3-L4, severe spinal canal stenosis, severe left foraminal narrowing, mild to moderate right foraminal narrowing. Underwent lumbar discectomy 10/2012  Other MSK - new  . Location: lower back and hip, radiating into groin area . Quality: soreness, occasional sharp  . Duration: worse over past 3.5 weeks . Context: cares for her 4 year old mother in [place], possible strain.  . Modifying factors: worse with walking  Osteopenia DEXA 2018  History of Hyperglycemia On Actos, neve diabetic range Would like to try coming off this medicine       Past medical, surgical, social and family history reviewed:  Patient Active Problem List   Diagnosis Date Noted  . Hyperglycemia 04/24/2014  . Hypokalemia 09/02/2013  . Routine general medical examination at a health care facility 09/01/2013  . Low back pain 08/31/2012  . Encounter for long-term (current) use of other medications 08/13/2011  . 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 Numbness 08/13/2011  . HYPERCHOLESTEROLEMIA 08/11/2010  . Asymptomatic postmenopausal status 08/03/2008  . Anemia 08/03/2007  . NONSPECIFIC ABNORMAL ELECTROCARDIOGRAM 08/03/2007  . Essential hypertension 02/19/2007  . ALLERGIC RHINITIS 02/19/2007  . OSTEOARTHRITIS 02/19/2007    Past Surgical  History:  Procedure Laterality Date  . BREAST BIOPSY    . LUMBAR DISC SURGERY    . rt knee arthroscopy    . THYROIDECTOMY, PARTIAL      Social History   Tobacco Use  . Smoking status: Former Smoker    Last attempt to quit: 09/09/1986    Years since quitting: 31.6  . Smokeless tobacco: Never Used  Substance Use Topics  . Alcohol use: Yes    Alcohol/week: 14.0 standard drinks    Types: 14 Standard drinks or equivalent per week    Family History  Problem Relation Age of Onset  . Colon cancer Mother      Current medication list and allergy/intolerance information reviewed:    Current Outpatient Medications  Medication Sig Dispense Refill  . amLODipine (NORVASC) 2.5 MG tablet TAKE 1 TABLET BY MOUTH  DAILY 90 tablet 3  . carvedilol (COREG) 3.125 MG tablet TAKE 1/2 TABLET BY MOUTH 2  TIMES DAILY WITH A MEAL. 30 tablet 1  . cholecalciferol (VITAMIN D) 1000 UNITS tablet Take 1,000 Units by mouth daily.    . ferrous sulfate 325 (65 FE) MG tablet Take 325 mg by mouth daily with breakfast.    . ibuprofen (ADVIL,MOTRIN) 200 MG tablet 200 mg daily. 3 tablets every morning and 3 tablets every evening    . levothyroxine (SYNTHROID, LEVOTHROID) 50 MCG tablet TAKE 1 TABLET BY MOUTH  DAILY BEFORE BREAKFAST 90 tablet 3  . losartan-hydrochlorothiazide (HYZAAR) 100-25 MG tablet Take 1 tablet by mouth daily. 90 tablet 3  . lovastatin (MEVACOR) 40 MG tablet TAKE 1 TABLET BY MOUTH TWO  TIMES DAILY 180 tablet 3  . pioglitazone (ACTOS) 15 MG tablet TAKE 1 TABLET  BY MOUTH  DAILY 90 tablet 1  . Potassium Chloride CR (MICRO-K) 8 MEQ CPCR capsule CR TAKE 2 CAPSULES BY MOUTH  TWO TIMES DAILY 360 capsule 3  . raloxifene (EVISTA) 60 MG tablet TAKE 1 TABLET BY MOUTH  DAILY 90 tablet 3  . RESTASIS 0.05 % ophthalmic emulsion Place 1 drop into both eyes every 12 (twelve) hours.      No current facility-administered medications for this visit.     Allergies  Allergen Reactions  . Erythromycin     REACTION:  Rash  . Meperidine Other (See Comments)    Gi upset  . Meperidine Hcl     REACTION: Nausea      Review of Systems:  Constitutional:  No  fever, no chills, No recent illness, No unintentional weight changes. No significant fatigue.   HEENT: No  headache, no vision change, no hearing change, No sore throat, No  sinus pressure  Cardiac: No  chest pain, No  pressure, No palpitations, No  Orthopnea  Respiratory:  No  shortness of breath. No  Cough  Gastrointestinal: No  abdominal pain, No  nausea, No  vomiting,  No  blood in stool, No  diarrhea, No  constipation   Musculoskeletal: +new myalgia/arthralgia  Skin: No  Rash, No other wounds/concerning lesions  Genitourinary: No  incontinence, No  abnormal genital bleeding, No abnormal genital discharge  Hem/Onc: No  easy bruising/bleeding, No  abnormal lymph node  Endocrine: No cold intolerance,  No heat intolerance. No polyuria/polydipsia/polyphagia   Neurologic: No  weakness, No  dizziness, No  slurred speech/focal weakness/facial droop  Psychiatric: +concerns with depression, +concerns with anxiety, No sleep problems, No mood problems  Depression screen PHQ 2/9 05/03/2018  Decreased Interest 1  Down, Depressed, Hopeless 1  PHQ - 2 Score 2  Altered sleeping 1  Tired, decreased energy 1  Change in appetite 0  Feeling bad or failure about yourself  0  Trouble concentrating 0  Moving slowly or fidgety/restless 0  Suicidal thoughts 0  PHQ-9 Score 4  Difficult doing work/chores Somewhat difficult   GAD 7 : Generalized Anxiety Score 05/03/2018  Nervous, Anxious, on Edge 1  Control/stop worrying 0  Worry too much - different things 1  Trouble relaxing 1  Restless 0  Easily annoyed or irritable 1  Afraid - awful might happen 0  Total GAD 7 Score 4  Anxiety Difficulty Somewhat difficult      Exam:  BP (!) 143/81 (BP Location: Left Arm, Patient Position: Sitting, Cuff Size: Normal)   Pulse 75   Temp 98.1 F (36.7 C)  (Oral)   Ht 5\' 3"  (1.6 m)   Wt 151 lb 6.4 oz (68.7 kg)   BMI 26.82 kg/m   Constitutional: VS see above. General Appearance: alert, well-developed, well-nourished, NAD  Eyes: Normal lids and conjunctive, non-icteric sclera  Ears, Nose, Mouth, Throat: MMM, Normal external inspection ears/nares/mouth/lips/gums.  Neck: No masses, trachea midline. No thyroid enlargement. No tenderness/mass appreciated. No lymphadenopathy  Respiratory: Normal respiratory effort. no wheeze, no rhonchi, no rales  Cardiovascular: S1/S2 normal, no murmur, no rub/gallop auscultated. RRR. No lower extremity edema.  Gastrointestinal: Nontender, no masses. No hepatomegaly, no splenomegaly. No hernia appreciated. Bowel sounds normal. Rectal exam deferred.   Musculoskeletal: Gait normal. No clubbing/cyanosis of digits.   (+)pain at trochanteric bursa on R, (+) pain w/ FABER on R, neg log roll and internal rotation, neg SLR   Muscle spasm paralumbar on R   Strength 5/5 all extremities  Neurological: Normal balance/coordination. No tremor. No cranial nerve deficit on limited exam. Motor and sensation intact and symmetric. Cerebellar reflexes intact.   Skin: warm, dry, intact. No rash/ulcer. No concerning nevi or subq nodules on limited exam.    Psychiatric: Normal judgment/insight. Normal mood and affect. Oriented x3.     Labs 01/2018: TSH ok, Iron ok, A1C 5.8%, Hgb 11.3        ASSESSMENT/PLAN:   Essential hypertension  Postsurgical hypothyroidism  Arthritis  Anemia, unspecified type  Mixed hyperlipidemia  Low back pain radiating to right leg  Spinal stenosis of lumbar region, unspecified whether neurogenic claudication present  S/P right unicompartmental knee replacement  Positive depression screening - largely situational: caring for ill mom, dealing with stepdad's estate, recent deaths of aunt and friend.   Osteopenia, unspecified location  Right hip pain - Plan: DG HIP UNILAT W  OR W/O PELVIS 2-3 VIEWS RIGHT, predniSONE (DELTASONE) 20 MG tablet, cyclobenzaprine (FLEXERIL) 5 MG tablet, meloxicam (MOBIC) 7.5 MG tablet    Patient Instructions  Plan:  Given current stressors, I am not too worried about the blood pressure being a bit borderline.  We will plan to recheck this when I see you again in 3 months  OK to stop the Actos, let's follow-up on the A1c in 3 months  For hip pain, I suspect likely hip arthritis complicated by a trochanteric bursitis.  We will get x-rays today, can try ibuprofen for now and can switch to Mobic/meloxicam when you get this through mail order.  We will also trial steroid burst and you can take muscle relaxer cyclobenzaprine as needed  Prescription printed for shingles vaccine, can take to the pharmacy to have this administered.    Visit summary with medication list and pertinent instructions was printed for patient to review. All questions at time of visit were answered - patient instructed to contact office with any additional concerns. ER/RTC precautions were reviewed with the patient.   Follow-up plan: Return in about 3 months (around 08/03/2018), or sooner if needed, for recheck A1C (prediabetes) after stopping Actos. Recheck blood pressure. .    Please note: voice recognition software was used to produce this document, and typos may escape review. Please contact Dr. Sheppard Coil for any needed clarifications.

## 2018-05-15 ENCOUNTER — Encounter: Payer: Self-pay | Admitting: Osteopathic Medicine

## 2018-05-15 DIAGNOSIS — M25551 Pain in right hip: Secondary | ICD-10-CM

## 2018-05-17 ENCOUNTER — Other Ambulatory Visit: Payer: Self-pay | Admitting: Osteopathic Medicine

## 2018-05-17 MED ORDER — PREDNISONE 10 MG (48) PO TBPK: ORAL_TABLET | Freq: Every day | ORAL | 0 refills | Status: DC

## 2018-05-17 NOTE — Progress Notes (Signed)
Resent

## 2018-06-09 ENCOUNTER — Telehealth: Payer: Self-pay | Admitting: Osteopathic Medicine

## 2018-06-09 ENCOUNTER — Telehealth: Payer: Self-pay

## 2018-06-09 NOTE — Telephone Encounter (Signed)
[name] called and states the Losartan-HCTZ is on back order. She would like the medication sent in as separate medications to [facility].

## 2018-06-09 NOTE — Telephone Encounter (Signed)
Patient left a voicemail to cancel and reschedule appointment. They were going to be out of town. New appointment was made for 08/09/2018. °

## 2018-06-10 ENCOUNTER — Telehealth: Payer: Self-pay

## 2018-06-10 MED ORDER — CARVEDILOL 3.125 MG PO TABS: ORAL_TABLET | ORAL | 1 refills | Status: DC

## 2018-06-10 MED ORDER — HYDROCHLOROTHIAZIDE 25 MG PO TABS: 25.0000 mg | ORAL_TABLET | Freq: Every day | ORAL | 3 refills | Status: DC

## 2018-06-10 MED ORDER — LOSARTAN POTASSIUM 100 MG PO TABS: 100.0000 mg | ORAL_TABLET | Freq: Every day | ORAL | 3 refills | Status: DC

## 2018-06-10 MED ORDER — POTASSIUM CHLORIDE ER 8 MEQ PO CPCR: 16.0000 meq | ORAL_CAPSULE | Freq: Two times a day (BID) | ORAL | 1 refills | Status: DC

## 2018-06-10 NOTE — Telephone Encounter (Signed)
[facility]

## 2018-06-10 NOTE — Telephone Encounter (Signed)
Pt advised.

## 2018-06-10 NOTE — Telephone Encounter (Signed)
Left VM with update.  

## 2018-06-10 NOTE — Telephone Encounter (Signed)
[facility] pharmacy requesting med refills for carvedilol and potassium cl. Written by endocrinologist. Pls advise, thanks.

## 2018-06-10 NOTE — Telephone Encounter (Signed)
Sent!

## 2018-07-08 ENCOUNTER — Encounter: Payer: Self-pay | Admitting: Osteopathic Medicine

## 2018-07-08 DIAGNOSIS — M25551 Pain in right hip: Secondary | ICD-10-CM

## 2018-07-08 MED ORDER — MELOXICAM 7.5 MG PO TABS: 7.5000 mg | ORAL_TABLET | Freq: Every day | ORAL | 0 refills | Status: DC

## 2018-07-18 ENCOUNTER — Other Ambulatory Visit: Payer: Self-pay

## 2018-07-18 NOTE — Patient Outreach (Signed)
[facility]) Care Management  07/18/2018  [name] [birth year] [id number]   Medication Adherence call to [name] patient did not answer patient is due on Pioglitazone 15 mg and Lovastatin 40 mg. [name] is showing past due under [place].   [facility] Management Direct Dial [phone]  Fax [phone] [email or web address]

## 2018-07-20 ENCOUNTER — Other Ambulatory Visit: Payer: Self-pay | Admitting: Endocrinology

## 2018-07-20 ENCOUNTER — Other Ambulatory Visit: Payer: Self-pay | Admitting: Osteopathic Medicine

## 2018-07-20 DIAGNOSIS — M25551 Pain in right hip: Secondary | ICD-10-CM

## 2018-07-20 NOTE — Telephone Encounter (Signed)
Please forward refill request to pt's new primary care provider.  

## 2018-07-20 NOTE — Telephone Encounter (Signed)
Please review for refill- patient at [facility] 

## 2018-07-20 NOTE — Telephone Encounter (Signed)
Please advise if refill is appropriate 

## 2018-08-03 ENCOUNTER — Ambulatory Visit: Payer: Medicare Other | Admitting: Osteopathic Medicine

## 2018-08-09 ENCOUNTER — Encounter: Payer: Self-pay | Admitting: Family Medicine

## 2018-08-09 ENCOUNTER — Ambulatory Visit (INDEPENDENT_AMBULATORY_CARE_PROVIDER_SITE_OTHER): Payer: Medicare Other | Admitting: Osteopathic Medicine

## 2018-08-09 ENCOUNTER — Ambulatory Visit (INDEPENDENT_AMBULATORY_CARE_PROVIDER_SITE_OTHER): Payer: Medicare Other | Admitting: Family Medicine

## 2018-08-09 ENCOUNTER — Encounter: Payer: Self-pay | Admitting: Osteopathic Medicine

## 2018-08-09 VITALS — BP 158/86 | HR 85 | Temp 97.7°F | Wt 152.0 lb

## 2018-08-09 VITALS — BP 156/91 | HR 88 | Wt 152.0 lb

## 2018-08-09 DIAGNOSIS — R7303 Prediabetes: Secondary | ICD-10-CM

## 2018-08-09 DIAGNOSIS — I10 Essential (primary) hypertension: Secondary | ICD-10-CM

## 2018-08-09 DIAGNOSIS — M5416 Radiculopathy, lumbar region: Secondary | ICD-10-CM | POA: Diagnosis not present

## 2018-08-09 LAB — POCT GLYCOSYLATED HEMOGLOBIN (HGB A1C): HEMOGLOBIN A1C: 5.8 % — AB (ref 4.0–5.6)

## 2018-08-09 MED ORDER — GABAPENTIN 300 MG PO CAPS: ORAL_CAPSULE | ORAL | 3 refills | Status: DC

## 2018-08-09 MED ORDER — DIAZEPAM 5 MG PO TABS: ORAL_TABLET | ORAL | 0 refills | Status: DC

## 2018-08-09 NOTE — Progress Notes (Signed)
HPI: [name] is a 73 y.o. female who  has a past medical history of Anemia, Arthritis, Blood transfusion, Diabetes mellitus, High cholesterol, Hypertension, and Thyroid disease.  she presents to [facility] today, 08/09/18,  for chief complaint of:  Followup blood pressure  Patient reports she has not really been checking her blood pressures at home lately.  When she was doing this a few months ago, systolic was in the 563O to 120s.  She reports some elements of whitecoat hypertension, no chest pain or pressure, no shortness of breath.  Compliant with medications as listed below.   BP Readings from Last 3 Encounters:  08/09/18 (!) 158/86  08/09/18 (!) 156/91  05/03/18 (!) 143/81     Diabetes: A1c looks good today.  Stable at 5.8.      At today's visit 08/09/18 ... PMH, PSH, FH reviewed and updated as needed.  Current medication list and allergy/intolerance hx reviewed and updated as needed. (See remainder of HPI, ROS, Phys Exam below)   No results found.  Results for orders placed or performed in visit on 08/09/18 (from the past 72 hour(s))  POCT HgB A1C     Status: Abnormal   Collection Time: 08/09/18  3:03 PM  Result Value Ref Range   Hemoglobin A1C 5.8 (A) 4.0 - 5.6 %   HbA1c POC (<> result, manual entry)     HbA1c, POC (prediabetic range)     HbA1c, POC (controlled diabetic range)            ASSESSMENT/PLAN: The primary encounter diagnosis was Essential hypertension. A diagnosis of Prediabetes was also pertinent to this visit.   Orders Placed This Encounter  Procedures  . CBC  . COMPLETE METABOLIC PANEL WITH GFR  . Lipid panel  . TSH  . POCT HgB A1C     No orders of the defined types were placed in this encounter.   Patient Instructions  Plan to return for nurse visit to verify home blood pressure cuff. In the meantime, be keeping a record of your blood pressures at home and Moreno this with you to the  visit with the nurse.   If your cuff is measuring within 5-10 points of ours AND your home numbers are less than 140/90 (ideally less than 130/80) then nothing else to do.   If your home blood pressure cuff is inaccurate or is accurate but measuring above goal, we will need to talk about adjusting your medications.       Follow-up plan: Return for nurse visit verify home BP monitor - follow up with Dr A depending on that visit .                                                 ################################################# ################################################# ################################################# #################################################    Current Meds  Medication Sig  . amLODipine (NORVASC) 2.5 MG tablet TAKE 1 TABLET BY MOUTH  DAILY  . carvedilol (COREG) 3.125 MG tablet TAKE 1/2 TABLET BY MOUTH 2  TIMES DAILY WITH A MEAL.  . cholecalciferol (VITAMIN D) 1000 UNITS tablet Take 1,000 Units by mouth daily.  . cyclobenzaprine (FLEXERIL) 5 MG tablet Take 1-2 tablets (5-10 mg total) by mouth 3 (three) times daily as needed for muscle spasms.  . ferrous sulfate 325 (65 FE) MG tablet Take 325 mg by mouth daily  with breakfast.  . hydrochlorothiazide (HYDRODIURIL) 25 MG tablet Take 1 tablet (25 mg total) by mouth daily.  Marland Kitchen ibuprofen (ADVIL,MOTRIN) 200 MG tablet 200 mg daily. 3 tablets every morning and 3 tablets every evening  . levothyroxine (SYNTHROID, LEVOTHROID) 50 MCG tablet TAKE 1 TABLET BY MOUTH  DAILY BEFORE BREAKFAST  . losartan (COZAAR) 100 MG tablet Take 1 tablet (100 mg total) by mouth daily.  Marland Kitchen losartan-hydrochlorothiazide (HYZAAR) 100-25 MG tablet Take 1 tablet by mouth daily.  Marland Kitchen lovastatin (MEVACOR) 40 MG tablet TAKE 1 TABLET BY MOUTH TWO  TIMES DAILY  . meloxicam (MOBIC) 7.5 MG tablet Take 1-2 tablets (7.5-15 mg total) by mouth daily. As needed for aches/pains and arthritis  . pioglitazone  (ACTOS) 15 MG tablet TAKE 1 TABLET BY MOUTH  DAILY  . Potassium Chloride CR (MICRO-K) 8 MEQ CPCR capsule CR Take 2 capsules (16 mEq total) by mouth 2 (two) times daily.  . predniSONE (STERAPRED UNI-PAK 48 TAB) 10 MG (48) TBPK tablet Take by mouth daily. 12-Day taper, po  . raloxifene (EVISTA) 60 MG tablet TAKE 1 TABLET BY MOUTH  DAILY  . RESTASIS 0.05 % ophthalmic emulsion Place 1 drop into both eyes every 12 (twelve) hours.     Allergies  Allergen Reactions  . Erythromycin     REACTION: Rash  . Meperidine Other (See Comments)    Gi upset  . Meperidine Hcl     REACTION: Nausea       Review of Systems:  Constitutional: No recent illness  HEENT: No  headache, no vision change  Cardiac: No  chest pain, No  pressure, No palpitations  Respiratory:  No  shortness of breath. No  Cough  Gastrointestinal: No  abdominal pain, no change on bowel habits  Neurologic: No  weakness, No  Dizziness  Psychiatric: No  concerns with depression, +concerns with anxiety  Exam:  BP (!) 158/86 (BP Location: Left Arm, Patient Position: Sitting, Cuff Size: Normal)   Pulse 85   Temp 97.7 F (36.5 C) (Oral)   Wt 152 lb (68.9 kg)   BMI 26.93 kg/m   Constitutional: VS see above. General Appearance: alert, well-developed, well-nourished, NAD  Eyes: Normal lids and conjunctive, non-icteric sclera  Ears, Nose, Mouth, Throat: MMM, Normal external inspection ears/nares/mouth/lips/gums.  Neck: No masses, trachea midline.   Respiratory: Normal respiratory effort. no wheeze, no rhonchi, no rales  Cardiovascular: S1/S2 normal, no murmur, no rub/gallop auscultated. RRR.   Musculoskeletal: Gait normal. Symmetric and independent movement of all extremities  Neurological: Normal balance/coordination. No tremor.  Skin: warm, dry, intact.   Psychiatric: Normal judgment/insight. Normal mood and affect. Oriented x3.       Visit summary with medication list and pertinent instructions was printed  for patient to review, patient was advised to alert [place] if any updates are needed. All questions at time of visit were answered - patient instructed to contact office with any additional concerns. ER/RTC precautions were reviewed with the patient and understanding verbalized.   Note: Total time spent 25 minutes, greater than 50% of the visit was spent face-to-face counseling and coordinating care for the following: The primary encounter diagnosis was Essential hypertension. A diagnosis of Prediabetes was also pertinent to this visit.Marland Kitchen  Please note: voice recognition software was used to produce this document, and typos may escape review. Please contact Dr. Sheppard Coil for any needed clarifications.    Follow up plan: Return for nurse visit verify home BP monitor - follow up with Dr A depending on  that visit .

## 2018-08-09 NOTE — Progress Notes (Addendum)
[name] is a 73 y.o. female who presents to [place] today for right leg  Patient has been having ongoing low back and right hip pain for the past several months.  She had initial evaluation with her primary care provider Dr. Sheppard Coil on November 2019 which showed relatively normal-appearing hip joints.  She had trial of meloxicam which has helped a bit.  Additionally she is had short course of steroids which helped a bit as well.  The pain started suddenly in October and has been continuous since.  She notes pain radiating from her anterior hip to her medial knee.  Pain is worse with activity and somewhat better with rest.  She also has had treatment with a chiropractor in [place] since late December.  She notes that she is had moderate benefit from this treatment but not complete benefit.  She notes continued significantly bothersome right leg pain limiting her activities.  She denies any injury fevers or chills.  She had x-rays at her chiropractor of her lumbar spine showing degenerative changes and some scoliotic changes without fracture.  X-rays were obtained December 2019.  Images reviewed in clinic today.  Patient has a pertinent orthopedic history for right partial knee replacement, and significant lumbar spine stenosis with degenerative changes status post lumbar discectomy at L3-4 in 2014.  ROS:  As above  Exam:  BP (!) 156/91   Pulse 88   Wt 152 lb (68.9 kg)   BMI 26.93 kg/m  Wt Readings from Last 5 Encounters:  08/09/18 152 lb (68.9 kg)  05/03/18 151 lb 6.4 oz (68.7 kg)  02/09/18 148 lb 9.6 oz (67.4 kg)  04/12/17 150 lb 6 oz (68.2 kg)  11/17/16 162 lb (73.5 kg)   General: Well Developed, well nourished, and in no acute distress.  Neuro/Psych: Alert and oriented x3, extra-ocular muscles intact, able to move all 4 extremities, sensation grossly intact. Skin: Warm and dry, no rashes noted.  Respiratory: Not using  accessory muscles, speaking in full sentences, trachea midline.  Cardiovascular: Pulses palpable, no extremity edema. Abdomen: Does not appear distended. MSK: L-spine: Nontender to spinal midline.  Tender palpation lumbar paraspinal musculature especially on the right. Range of motion limited extension.  Limited rotation and lateral flexion bilaterally.  Intact flexion. Sensation is intact throughout bilateral lower extremities. Reflexes reduced but equal bilateral knees and Achilles tendon. Strength: Right hip flexion 4/5.  Left hip flexion 5/5. Hip abduction 5/5 bilaterally Hip adduction 5/5 bilaterally. Right knee extension 4/5.  Left knee extension 5/5 Right knee flexion 5/5.  Left knee flexion 5/5. Foot dorsiflexion 5/5 bilateral. Foot plantar flexion 5/5 bilateral  Positive right-sided slump test.  Right hip: Normal-appearing normal motion.  Tender palpation greater trochanter.  Hip abduction strength intact.  Left hip normal-appearing normal motion.  Tender palpation greater trochanter.  Hip abduction strength intact.  Antalgic gait present.    Lab and Radiology Results EXAM: DG HIP (WITH OR WITHOUT PELVIS) 2-3V RIGHT  COMPARISON:  None.  FINDINGS: Femoral heads are normally located. Hip joint spaces appear symmetric and normal for age. Bone mineralization is within normal limits for age. Intact pelvis. Sacral ala and SI joints appear normal. Grossly intact proximal left femur. The proximal right femur is intact and appears normal. Partially visible lumbar spine degeneration. Negative visible lower abdominal and pelvic visceral contours.  IMPRESSION: Normal for age radiographic appearance of the right hip and pelvis.   Electronically Signed   By: Genevie Ann  M.D.   On: 05/04/2018 07:49  I personally (independently) visualized and performed the interpretation of the images attached in this note.  X-ray images from chiropractor reviewed showing changes as  above.  MR SPINE LUMBAR WO CONTRAST4/24/2014 [facility] Result Impression   1. Multilevel degenerative disc disease in the lumbar spine worst at L3-L4 now results in severe spinal canal stenosis, severe left foraminal narrowing, and and mild to moderate right foraminal narrowing. 2. Interval increase in epidural fat at the level of L5-S1 now results in severe spinal canal stenosis. 3. Otherwise similar appearance of multilevel degenerative disc disease, as detailed above.  4. Diffuse heterogeneous marrow signal is nonspecific, but can be seen in individuals with chronic anemia, chronic hypoxemia (such as smoking), and myeloproliferative disorders.  Result Narrative  MRI LUMBAR SPINE WITHOUT CONTRAST, Oct 12, 2012 10:17:04 AM . INDICATION: Back pain COMPARISON: MR 01/07/2012 . TECHNIQUE: Multiplanar, multi-sequence surface-coil MR imaging of the lumbar spine was performed without contrast. . LEVELS IMAGED: Lower thoracic to upper sacral region . FINDINGS: PLEASE NOTE: For purposes of this dictation, the disc space identified on axial sequencing, series 6 image 25, will be distinguished as L5-S1.   Alignment: Degenerative anterolisthesis of L3 on L4 measures approximately 4 mm.  . Vertebrae: Diffuse heterogeneous marrow signal is nonspecific, but can be seen in individuals with chronic anemia, chronic hypoxemia (such as smoking) versus other marrow infiltrative/myeloproliferative disorders. Discogenic marrow changes within the adjacent endplates of the V4-M0 at L4-L5 disc spaces where there are Schmorl's nodes. . Conus: In normal position without imaging evidence for tethering. Normal signal and contour. . Disc desiccation at L2-L3, L3-L4, L4-L5, and L5-S1. Minimal degenerative bilateral facet fluid at L3-L4 and L2-L3. . T12-L1: No focal abnormality. . L1-L2: No focal abnormality. . L2-L3: Broad-based disc bulge which effaces the ventral thecal sac and lateral  recesses; no CSF signal encircles the nerve roots, consistent with moderate spinal canal stenosis. Bilateral mild facet hypertrophy. Mild right and moderate left neural foraminal narrowing. Marland Kitchen L3-L4: Broad-based disc bulge which is eccentric to the left. Bilateral moderate facet hypertrophy. Ligamentum flavum thickening. Effacement of the CSF with compression of the nerve roots, consistent with severe spinal canal stenosis. Mild to moderate right and severe left neural foraminal narrowing.  Marland Kitchen L4-L5: Broad-based disc bulge effaces the ventral thecal sac and extends into both lateral recesses. Bilateral mild facet hypertrophy. Moderate right and mild left foraminal narrowing. Decreased prominence of spinal canal stenosis, now mild. Marland Kitchen L5-S1: Prominent epidural fat. Mild broad-based disc bulge and facet hypertrophy. Right side predominant ligamentum flavum thickening. These changes result in severe spinal canal stenosis. Moderate right and mild left neural foraminal stenosis. Marland Kitchen Upper Sacrum/Ilium: No focal abnormality.     Assessment and Plan: 73 y.o. female with  Right leg pain.  Patient symptoms are consistent with right L3 radiculopathy.  She has significant degenerative changes and spinal stenosis seen on MRI from [place] in 2014.  Unfortunately she is only had partial success with chiropractor care and continues to have significant pain.  Plan for MRI for epidural steroid injection planning.  Patient also has some factors that may limit her care.  She lives most of the time in [place] caring for her sick mother.  She does not have good access to healthcare in [place] due to insurance issues and may have challenges getting epidural steroid injections performed in [place].  We will coordinate care with her chiropractor in [place] who has been providing care.  Dr Charlestine [facility] [phone]  Addendum:  Received fax back from Dr Edmonia Lynch  at [facility]  They recommend Dr Jenny Reichmann Ruxer as part of the [facility] who can do the epidural steroid injections. I would need to place a referral.   Phone [phone] [place]    PDMP not reviewed this encounter. Orders Placed This Encounter  Procedures  . MR Lumbar Spine Wo Contrast    Standing Status:   Future    Standing Expiration Date:   10/08/2019    Order Specific Question:   What is the patient's sedation requirement?    Answer:   No Sedation    Order Specific Question:   Does the patient have a pacemaker or implanted devices?    Answer:   No    Order Specific Question:   Preferred imaging location?    Answer:   Product/process development scientist (table limit-350lbs)    Order Specific Question:   Radiology Contrast Protocol - do NOT remove file path    Answer:   [email or web address]   Meds ordered this encounter  Medications  . gabapentin (NEURONTIN) 300 MG capsule    Sig: One tab PO qHS for a week, then BID for a week, then TID. May double weekly to a max of 3,600mg /day    Dispense:  180 capsule    Refill:  3  . diazepam (VALIUM) 5 MG tablet    Sig: 1-2 tabs 1 hr prior to mri    Dispense:  4 tablet    Refill:  0    Historical information moved to improve visibility of documentation.  Past Medical History:  Diagnosis Date  . Anemia   . Arthritis   . Blood transfusion    for GI bleed 1987  . Diabetes mellitus   . High cholesterol   . Hypertension   . Thyroid disease    Past Surgical History:  Procedure Laterality Date  . BREAST BIOPSY    . LUMBAR DISC SURGERY  10/26/2012  . rt knee arthroscopy  05/2008  . THYROIDECTOMY, PARTIAL  05/2002   Social History   Tobacco Use  . Smoking status: Former Smoker    Last attempt to quit: 09/09/1986    Years since quitting: 31.9  . Smokeless tobacco: Never Used  Substance Use Topics  . Alcohol use: Yes    Alcohol/week: 14.0 standard drinks     Types: 14 Standard drinks or equivalent per week   family history includes Breast cancer in her mother; Colon cancer in her mother; Heart attack in her father and maternal grandfather; High blood pressure in her father and mother; Stroke in her maternal grandfather and paternal grandfather.  Medications: Current Outpatient Medications  Medication Sig Dispense Refill  . amLODipine (NORVASC) 2.5 MG tablet TAKE 1 TABLET BY MOUTH  DAILY 90 tablet 3  . carvedilol (COREG) 3.125 MG tablet TAKE 1/2 TABLET BY MOUTH 2  TIMES DAILY WITH A MEAL. 90 tablet 1  . cholecalciferol (VITAMIN D) 1000 UNITS tablet Take 1,000 Units by mouth daily.    . cyclobenzaprine (FLEXERIL) 5 MG tablet Take 1-2 tablets (5-10 mg total) by mouth 3 (three) times daily as needed for muscle spasms. 60 tablet 1  . ferrous sulfate 325 (65 FE) MG tablet Take 325 mg by mouth daily with breakfast.    . hydrochlorothiazide (HYDRODIURIL) 25 MG tablet Take 1 tablet (25 mg total) by mouth daily. 90 tablet 3  . ibuprofen (ADVIL,MOTRIN)  200 MG tablet 200 mg daily. 3 tablets every morning and 3 tablets every evening    . levothyroxine (SYNTHROID, LEVOTHROID) 50 MCG tablet TAKE 1 TABLET BY MOUTH  DAILY BEFORE BREAKFAST 90 tablet 3  . losartan (COZAAR) 100 MG tablet Take 1 tablet (100 mg total) by mouth daily. 90 tablet 3  . losartan-hydrochlorothiazide (HYZAAR) 100-25 MG tablet Take 1 tablet by mouth daily. 90 tablet 3  . lovastatin (MEVACOR) 40 MG tablet TAKE 1 TABLET BY MOUTH TWO  TIMES DAILY 180 tablet 3  . meloxicam (MOBIC) 7.5 MG tablet Take 1-2 tablets (7.5-15 mg total) by mouth daily. As needed for aches/pains and arthritis 180 tablet 0  . pioglitazone (ACTOS) 15 MG tablet TAKE 1 TABLET BY MOUTH  DAILY 90 tablet 1  . Potassium Chloride CR (MICRO-K) 8 MEQ CPCR capsule CR Take 2 capsules (16 mEq total) by mouth 2 (two) times daily. 360 capsule 1  . predniSONE (STERAPRED UNI-PAK 48 TAB) 10 MG (48) TBPK tablet Take by mouth daily. 12-Day  taper, po 48 tablet 0  . raloxifene (EVISTA) 60 MG tablet TAKE 1 TABLET BY MOUTH  DAILY 90 tablet 3  . RESTASIS 0.05 % ophthalmic emulsion Place 1 drop into both eyes every 12 (twelve) hours.     . diazepam (VALIUM) 5 MG tablet 1-2 tabs 1 hr prior to mri 4 tablet 0  . gabapentin (NEURONTIN) 300 MG capsule One tab PO qHS for a week, then BID for a week, then TID. May double weekly to a max of 3,600mg /day 180 capsule 3   No current facility-administered medications for this visit.    Allergies  Allergen Reactions  . Erythromycin     REACTION: Rash  . Meperidine Other (See Comments)    Gi upset  . Meperidine Hcl     REACTION: Nausea      Discussed warning signs or symptoms. Please see discharge instructions. Patient expresses understanding.

## 2018-08-09 NOTE — Patient Instructions (Addendum)
Thank you for coming in today. You should hear about lumbar MRI soon.  Let me know if you do not hear anything.   We can coordinate care in [place].   I likely will be ordering epidural steroid injection.   We will try gabapentin for pain as well.    Radicular Pain Radicular pain is a type of pain that spreads from your back or neck along a spinal nerve. Spinal nerves are nerves that leave the spinal cord and go to the muscles. Radicular pain is sometimes called radiculopathy, radiculitis, or a pinched nerve. When you have this type of pain, you may also have weakness, numbness, or tingling in the area of your body that is supplied by the nerve. The pain may feel sharp and burning. Depending on which spinal nerve is affected, the pain may occur in the:  Neck area (cervical radicular pain). You may also feel pain, numbness, weakness, or tingling in the arms.  Mid-spine area (thoracic radicular pain). You would feel this pain in the back and chest. This type is rare.  Lower back area (lumbar radicular pain). You would feel this pain as low back pain. You may feel pain, numbness, weakness, or tingling in the buttocks or legs. Sciatica is a type of lumbar radicular pain that shoots down the back of the leg. Radicular pain occurs when one of the spinal nerves becomes irritated or squeezed (compressed). It is often caused by something pushing on a spinal nerve, such as one of the bones of the spine (vertebrae) or one of the round cushions between vertebrae (intervertebral disks). This can result from:  An injury.  Wear and tear or aging of a disk.  The growth of a bone spur that pushes on the nerve. Radicular pain often goes away when you follow instructions from your health care provider for relieving pain at home. Follow these instructions at home: Managing pain      If directed, put ice on the affected area: ? Put ice in a plastic bag. ? Place a towel between your skin and the  bag. ? Leave the ice on for 20 minutes, 2-3 times a day.  If directed, apply heat to the affected area as often as told by your health care provider. Use the heat source that your health care provider recommends, such as a moist heat pack or a heating pad. ? Place a towel between your skin and the heat source. ? Leave the heat on for 20-30 minutes. ? Remove the heat if your skin turns bright red. This is especially important if you are unable to feel pain, heat, or cold. You may have a greater risk of getting burned. Activity   Do not sit or rest in bed for long periods of time.  Try to stay as active as possible. Ask your health care provider what type of exercise or activity is best for you.  Avoid activities that make your pain worse, such as bending and lifting.  Do not lift anything that is heavier than 10 lb (4.5 kg), or the limit that you are told, until your health care provider says that it is safe.  Practice using proper technique when lifting items. Proper lifting technique involves bending your knees and rising up.  Do strength and range-of-motion exercises only as told by your health care provider or physical therapist. General instructions  Take over-the-counter and prescription medicines only as told by your health care provider.  Pay attention to any changes in  your symptoms.  Keep all follow-up visits as told by your health care provider. This is important. ? Your health care provider may send you to a physical therapist to help with this pain. Contact a health care provider if:  Your pain and other symptoms get worse.  Your pain medicine is not helping.  Your pain has not improved after a few weeks of home care.  You have a fever. Get help right away if:  You have severe pain, weakness, or numbness.  You have difficulty with bladder or bowel control. Summary  Radicular pain is a type of pain that spreads from your back or neck along a spinal  nerve.  When you have radicular pain, you may also have weakness, numbness, or tingling in the area of your body that is supplied by the nerve.  The pain may feel sharp or burning.  Radicular pain may be treated with ice, heat, medicines, or physical therapy. This information is not intended to replace advice given to you by your health care provider. Make sure you discuss any questions you have with your health care provider. Document Released: 07/16/2004 Document Revised: 12/21/2017 Document Reviewed: 12/21/2017 Elsevier Interactive Patient Education  2019 [place].   Gabapentin capsules or tablets What is this medicine? GABAPENTIN (GA ba pen tin) is used to control seizures in certain types of epilepsy. It is also used to treat certain types of nerve pain. This medicine may be used for other purposes; ask your health care provider or pharmacist if you have questions. COMMON BRAND NAME(S): Active-PAC with Gabapentin, Gabarone, Neurontin What should I tell my health care provider before I take this medicine? They need to know if you have any of these conditions: -kidney disease -suicidal thoughts, plans, or attempt; a previous suicide attempt by you or a family member -an unusual or allergic reaction to gabapentin, other medicines, foods, dyes, or preservatives -pregnant or trying to get pregnant -breast-feeding How should I use this medicine? Take this medicine by mouth with a glass of water. Follow the directions on the prescription label. You can take it with or without food. If it upsets your stomach, take it with food. Take your medicine at regular intervals. Do not take it more often than directed. Do not stop taking except on your doctor's advice. If you are directed to break the 600 or 800 mg tablets in half as part of your dose, the extra half tablet should be used for the next dose. If you have not used the extra half tablet within 28 days, it should be thrown away. A  special MedGuide will be given to you by the pharmacist with each prescription and refill. Be sure to read this information carefully each time. Talk to your pediatrician regarding the use of this medicine in children. While this drug may be prescribed for children as young as 3 years for selected conditions, precautions do apply. Overdosage: If you think you have taken too much of this medicine contact a poison control center or emergency room at once. NOTE: This medicine is only for you. Do not share this medicine with others. What if I miss a dose? If you miss a dose, take it as soon as you can. If it is almost time for your next dose, take only that dose. Do not take double or extra doses. What may interact with this medicine? Do not take this medicine with any of the following medications: -other gabapentin products This medicine may also interact with  the following medications: -alcohol -antacids -antihistamines for allergy, cough and cold -certain medicines for anxiety or sleep -certain medicines for depression or psychotic disturbances -homatropine; hydrocodone -naproxen -narcotic medicines (opiates) for pain -phenothiazines like chlorpromazine, mesoridazine, prochlorperazine, thioridazine This list may not describe all possible interactions. Give your health care provider a list of all the medicines, herbs, non-prescription drugs, or dietary supplements you use. Also tell them if you smoke, drink alcohol, or use illegal drugs. Some items may interact with your medicine. What should I watch for while using this medicine? Visit your doctor or health care professional for regular checks on your progress. You may want to keep a record at home of how you feel your condition is responding to treatment. You may want to share this information with your doctor or health care professional at each visit. You should contact your doctor or health care professional if your seizures get worse or if  you have any new types of seizures. Do not stop taking this medicine or any of your seizure medicines unless instructed by your doctor or health care professional. Stopping your medicine suddenly can increase your seizures or their severity. Wear a medical identification bracelet or chain if you are taking this medicine for seizures, and carry a card that lists all your medications. You may get drowsy, dizzy, or have blurred vision. Do not drive, use machinery, or do anything that needs mental alertness until you know how this medicine affects you. To reduce dizzy or fainting spells, do not sit or stand up quickly, especially if you are an older patient. Alcohol can increase drowsiness and dizziness. Avoid alcoholic drinks. Your mouth may get dry. Chewing sugarless gum or sucking hard candy, and drinking plenty of water will help. The use of this medicine may increase the chance of suicidal thoughts or actions. Pay special attention to how you are responding while on this medicine. Any worsening of mood, or thoughts of suicide or dying should be reported to your health care professional right away. Women who become pregnant while using this medicine may enroll in the [place] Pregnancy Registry by calling [phone]. This registry collects information about the safety of antiepileptic drug use during pregnancy. What side effects may I notice from receiving this medicine? Side effects that you should report to your doctor or health care professional as soon as possible: -allergic reactions like skin rash, itching or hives, swelling of the face, lips, or tongue -worsening of mood, thoughts or actions of suicide or dying Side effects that usually do not require medical attention (report to your doctor or health care professional if they continue or are bothersome): -constipation -difficulty walking or controlling muscle movements -dizziness -nausea -slurred  speech -tiredness -tremors -weight gain This list may not describe all possible side effects. Call your doctor for medical advice about side effects. You may report side effects to FDA at 1-800-FDA-1088. Where should I keep my medicine? Keep out of reach of children. This medicine may cause accidental overdose and death if it taken by other adults, children, or pets. Mix any unused medicine with a substance like cat litter or coffee grounds. Then throw the medicine away in a sealed container like a sealed bag or a coffee can with a lid. Do not use the medicine after the expiration date. Store at room temperature between 15 and 30 degrees C (59 and 86 degrees F). NOTE: This sheet is a summary. It may not cover all possible information. If you have questions  about this medicine, talk to your doctor, pharmacist, or health care provider.  2019 Elsevier/Gold Standard (2017-11-11 13:21:44)

## 2018-08-09 NOTE — Patient Instructions (Signed)
Plan to return for nurse visit to verify home blood pressure cuff. In the meantime, be keeping a record of your blood pressures at home and bring this with you to the visit with the nurse.   If your cuff is measuring within 5-10 points of ours AND your home numbers are less than 140/90 (ideally less than 130/80) then nothing else to do.   If your home blood pressure cuff is inaccurate or is accurate but measuring above goal, we will need to talk about adjusting your medications.

## 2018-08-10 ENCOUNTER — Other Ambulatory Visit: Payer: Self-pay | Admitting: Osteopathic Medicine

## 2018-08-10 ENCOUNTER — Other Ambulatory Visit: Payer: Self-pay

## 2018-08-10 DIAGNOSIS — E039 Hypothyroidism, unspecified: Secondary | ICD-10-CM

## 2018-08-10 LAB — COMPLETE METABOLIC PANEL WITH GFR
AG Ratio: 1.9 (calc) (ref 1.0–2.5)
ALT: 12 U/L (ref 6–29)
AST: 18 U/L (ref 10–35)
Albumin: 4.4 g/dL (ref 3.6–5.1)
Alkaline phosphatase (APISO): 40 U/L (ref 37–153)
BUN: 10 mg/dL (ref 7–25)
CO2: 29 mmol/L (ref 20–32)
Calcium: 9.4 mg/dL (ref 8.6–10.4)
Chloride: 100 mmol/L (ref 98–110)
Creat: 0.69 mg/dL (ref 0.60–0.93)
GFR, EST AFRICAN AMERICAN: 100 mL/min/{1.73_m2} (ref >=60)
GFR, Est Non African American: 86 mL/min/{1.73_m2} (ref >=60)
Globulin: 2.3 g/dL (calc) (ref 1.9–3.7)
Glucose, Bld: 98 mg/dL (ref 65–139)
Potassium: 3.8 mmol/L (ref 3.5–5.3)
Sodium: 137 mmol/L (ref 135–146)
TOTAL PROTEIN: 6.7 g/dL (ref 6.1–8.1)
Total Bilirubin: 0.5 mg/dL (ref 0.2–1.2)

## 2018-08-10 LAB — LIPID PANEL
Cholesterol: 180 mg/dL (ref <200)
HDL: 85 mg/dL (ref >=50)
LDL Cholesterol (Calc): 79 mg/dL (calc)
NON-HDL CHOLESTEROL (CALC): 95 mg/dL (ref <130)
Total CHOL/HDL Ratio: 2.1 (calc) (ref <5.0)
Triglycerides: 77 mg/dL (ref <150)

## 2018-08-10 LAB — CBC
HCT: 36.4 % (ref 35.0–45.0)
HEMOGLOBIN: 12.4 g/dL (ref 11.7–15.5)
MCH: 31.2 pg (ref 27.0–33.0)
MCHC: 34.1 g/dL (ref 32.0–36.0)
MCV: 91.5 fL (ref 80.0–100.0)
MPV: 9.9 fL (ref 7.5–12.5)
Platelets: 345 10*3/uL (ref 140–400)
RBC: 3.98 10*6/uL (ref 3.80–5.10)
RDW: 12.6 % (ref 11.0–15.0)
WBC: 6.7 10*3/uL (ref 3.8–10.8)

## 2018-08-10 LAB — TSH: TSH: 0.29 m[IU]/L — AB (ref 0.40–4.50)

## 2018-08-10 MED ORDER — LEVOTHYROXINE SODIUM 25 MCG PO TABS: 25.0000 ug | ORAL_TABLET | Freq: Every day | ORAL | 0 refills | Status: DC

## 2018-08-10 NOTE — Telephone Encounter (Signed)
Please review for refill- patient at [facility] 

## 2018-08-10 NOTE — Telephone Encounter (Signed)
Need the thyroid medication to go to local pharmacy.

## 2018-08-12 ENCOUNTER — Telehealth: Payer: Self-pay | Admitting: Family Medicine

## 2018-08-12 NOTE — Telephone Encounter (Signed)
Pt would like to go to [facility] based on scheduling.

## 2018-08-12 NOTE — Telephone Encounter (Signed)
Left VM for Pt to see where she would like to have her MRI completed, she cannot wait until we have openings at [facility] due to travel caring for her mother.

## 2018-08-15 ENCOUNTER — Encounter: Payer: Self-pay | Admitting: Family Medicine

## 2018-08-15 DIAGNOSIS — M5116 Intervertebral disc disorders with radiculopathy, lumbar region: Secondary | ICD-10-CM | POA: Diagnosis not present

## 2018-08-15 DIAGNOSIS — M4726 Other spondylosis with radiculopathy, lumbar region: Secondary | ICD-10-CM | POA: Diagnosis not present

## 2018-08-15 DIAGNOSIS — M5416 Radiculopathy, lumbar region: Secondary | ICD-10-CM

## 2018-08-15 DIAGNOSIS — M4807 Spinal stenosis, lumbosacral region: Secondary | ICD-10-CM | POA: Diagnosis not present

## 2018-08-15 DIAGNOSIS — M48061 Spinal stenosis, lumbar region without neurogenic claudication: Secondary | ICD-10-CM | POA: Diagnosis not present

## 2018-08-16 ENCOUNTER — Ambulatory Visit (INDEPENDENT_AMBULATORY_CARE_PROVIDER_SITE_OTHER): Payer: Medicare Other | Admitting: Osteopathic Medicine

## 2018-08-16 VITALS — BP 155/77 | HR 76

## 2018-08-16 DIAGNOSIS — I10 Essential (primary) hypertension: Secondary | ICD-10-CM

## 2018-08-16 NOTE — Progress Notes (Signed)
Pt came into clinic today for BP check. At last OV her BP was high. BP Rx was changed to losartan and HCTZ separately vs combined Rx. Did bring home machine to check accuracy. Our readings: 155/76 (pulse 75), 155/77 (pulse 76). Home BP machine: 156/83 (pulse 75). Home machine within accuracy window. Home readings provided to PCP for review, Pt advised I would contact her with further recommendation. Verbalized understanding. Pt does plan to leave town on Monday and head back to [place].  Per Dr Georgina Snell, epidural order scheduled while in office today. Printed MRI report also provided. No further questions.

## 2018-08-17 MED ORDER — AMLODIPINE BESYLATE 5 MG PO TABS: 5.0000 mg | ORAL_TABLET | Freq: Every day | ORAL | Status: DC

## 2018-08-17 NOTE — Progress Notes (Signed)
Home BP numbers some at goal but most are above goal. I think we can increase the amlodipine from 2.5 mg to 5 mg - can double on what she has, and see how BP numbers are looking over the next week, call or message [place] w/ the numbers

## 2018-08-18 NOTE — Progress Notes (Signed)
Pt advised. She will increase amlodipine to 5mg  daily and send MyChart message next week with home readings.

## 2018-08-22 ENCOUNTER — Ambulatory Visit
Admission: RE | Admit: 2018-08-22 | Discharge: 2018-08-22 | Disposition: A | Discharge status: Home / Self Care | Payer: Medicare Other | Source: Ambulatory Visit | Attending: Family Medicine | Admitting: Family Medicine

## 2018-08-22 DIAGNOSIS — M5126 Other intervertebral disc displacement, lumbar region: Secondary | ICD-10-CM | POA: Diagnosis not present

## 2018-08-22 MED ORDER — IOPAMIDOL (ISOVUE-M 200) INJECTION 41%
1.0000 mL | Freq: Once | INTRAMUSCULAR | Status: AC
  Administered 2018-08-22: 1 mL via EPIDURAL

## 2018-08-22 MED ORDER — METHYLPREDNISOLONE ACETATE 40 MG/ML INJ SUSP (RADIOLOG
120.0000 mg | Freq: Once | INTRAMUSCULAR | Status: AC
  Administered 2018-08-22: 120 mg via EPIDURAL

## 2018-08-22 NOTE — Discharge Instructions (Signed)

## 2018-08-27 ENCOUNTER — Encounter: Payer: Self-pay | Admitting: Osteopathic Medicine

## 2018-09-01 ENCOUNTER — Encounter: Payer: Self-pay | Admitting: Osteopathic Medicine

## 2018-09-01 ENCOUNTER — Other Ambulatory Visit: Payer: Self-pay | Admitting: Osteopathic Medicine

## 2018-09-01 MED ORDER — AMLODIPINE BESYLATE 5 MG PO TABS: 5.0000 mg | ORAL_TABLET | Freq: Every day | ORAL | 1 refills | Status: DC

## 2018-09-08 ENCOUNTER — Other Ambulatory Visit: Payer: Self-pay | Admitting: Osteopathic Medicine

## 2018-09-08 DIAGNOSIS — M25551 Pain in right hip: Secondary | ICD-10-CM

## 2018-09-20 ENCOUNTER — Other Ambulatory Visit: Payer: Self-pay | Admitting: Endocrinology

## 2018-09-22 ENCOUNTER — Encounter: Payer: Self-pay | Admitting: Osteopathic Medicine

## 2018-09-23 MED ORDER — LEVOTHYROXINE SODIUM 25 MCG PO TABS: 25.0000 ug | ORAL_TABLET | Freq: Every day | ORAL | 0 refills | Status: DC

## 2018-10-14 ENCOUNTER — Encounter: Payer: Self-pay | Admitting: Osteopathic Medicine

## 2018-10-14 DIAGNOSIS — M25551 Pain in right hip: Secondary | ICD-10-CM

## 2018-10-18 ENCOUNTER — Encounter: Payer: Self-pay | Admitting: Osteopathic Medicine

## 2018-10-18 MED ORDER — MELOXICAM 7.5 MG PO TABS: 7.5000 mg | ORAL_TABLET | Freq: Every day | ORAL | 3 refills | Status: DC

## 2018-10-18 MED ORDER — LOVASTATIN 40 MG PO TABS: 40.0000 mg | ORAL_TABLET | Freq: Two times a day (BID) | ORAL | 3 refills | Status: DC

## 2018-11-09 ENCOUNTER — Other Ambulatory Visit: Payer: Self-pay | Admitting: Osteopathic Medicine

## 2018-11-15 ENCOUNTER — Ambulatory Visit (INDEPENDENT_AMBULATORY_CARE_PROVIDER_SITE_OTHER): Payer: Medicare Other | Admitting: *Deleted

## 2018-11-15 VITALS — BP 134/86 | HR 68 | Ht 63.0 in | Wt 150.0 lb

## 2018-11-15 DIAGNOSIS — Z0000 Encounter for general adult medical examination without abnormal findings: Secondary | ICD-10-CM | POA: Diagnosis not present

## 2018-11-15 NOTE — Patient Instructions (Addendum)
Please schedule your next medicare wellness visit with me in 1 yr.  [name] , Thank you for taking time to come for your Medicare Wellness Visit. I appreciate your ongoing commitment to your health goals. Please review the following plan we discussed and let me know if I can assist you in the future.  Continue doing brain stimulating activities (puzzles, reading, adult coloring books, staying active) to keep memory sharp.  Bring a copy of your living will and/or healthcare power of attorney to your next office visit. These are the goals we discussed: Goals    . Patient Stated     Patient stated would like to loose 5lbs. Would like to start walking again.

## 2018-11-15 NOTE — Progress Notes (Signed)
Subjective:   [name] is a 73 y.o. female who presents for an Initial Medicare Annual Wellness Visit.  Review of Systems    No ROS.  Medicare Wellness Virtual Visit.  Visual/audio telehealth visit, UTA vital signs.   See social history for additional risk factors.     Cardiac Risk Factors include: advanced age (>50men, >77 women);sedentary lifestyle Sleep patterns: Getting 8 hours of sleep a night. Wakes up during the night to use the bathroom 2-3 times a night. Feels refreshed upon waking up. Home Safety/Smoke Alarms: Feels safe in home. Smoke alarms in place.  Living environment; Lives with husband in a 1 story home. No stairs in the house. SHower is a walk in shower with grab rails in place. Seat Belt Safety/Bike Helmet: Wears seat belt.   Female:   Pap-  Aged out     Mammo-  UTD     Dexa scan-  UTD      CCS-  UTD     Objective:    Today's Vitals   11/15/18 1323  BP: 134/86  Pulse: 68  Weight: 150 lb (68 kg)  Height: 5\' 3"  (1.6 m)   Body mass index is 26.57 kg/m.  Advanced Directives 11/15/2018 02/09/2018 11/17/2016 09/03/2016 10/16/2015 03/08/2015 09/05/2014  Does Patient Have a Medical Advance Directive? Yes No Yes Yes No;Yes No No;Yes  Type of Paramedic of [place];Living will - Living will [place];Living will Living will;Healthcare Power of Attorney - Living will;Healthcare Power of Attorney  Does patient want to make changes to medical advance directive? No - Patient declined - - - - - -  Copy of [place] in Chart? No - copy requested - - - No - copy requested - No - copy requested    Current Medications (verified) Outpatient Encounter Medications as of 11/15/2018  Medication Sig  . amLODipine (NORVASC) 5 MG tablet Take 1 tablet (5 mg total) by mouth daily.  . calcium carbonate (TUMS - DOSED IN MG ELEMENTAL CALCIUM) 500 MG chewable tablet Chew 1 tablet by mouth 2 (two) times daily.  .  carvedilol (COREG) 3.125 MG tablet TAKE ONE-HALF TABLET BY  MOUTH TWICE A DAY WITH  MEALS  . cholecalciferol (VITAMIN D) 1000 UNITS tablet Take 1,000 Units by mouth daily.  . ferrous sulfate 325 (65 FE) MG tablet Take 325 mg by mouth daily with breakfast.  . hydrochlorothiazide (HYDRODIURIL) 25 MG tablet Take 1 tablet (25 mg total) by mouth daily.  Marland Kitchen levothyroxine (SYNTHROID, LEVOTHROID) 25 MCG tablet Take 1 tablet (25 mcg total) by mouth daily before breakfast.  . losartan (COZAAR) 100 MG tablet Take 1 tablet (100 mg total) by mouth daily.  Marland Kitchen lovastatin (MEVACOR) 40 MG tablet Take 1 tablet (40 mg total) by mouth 2 (two) times daily.  . Melatonin 5 MG TBDP Take by mouth. Take one by mouth at bedtime  . meloxicam (MOBIC) 7.5 MG tablet Take 1-2 tablets (7.5-15 mg total) by mouth daily. As needed for aches/pains and arthritis  . Potassium Chloride CR (MICRO-K) 8 MEQ CPCR capsule CR TAKE 2 CAPSULES BY MOUTH  TWO TIMES DAILY  . raloxifene (EVISTA) 60 MG tablet TAKE 1 TABLET BY MOUTH  DAILY  . gabapentin (NEURONTIN) 300 MG capsule One tab PO qHS for a week, then BID for a week, then TID. May double weekly to a max of 3,600mg /day (Patient not taking: Reported on 11/15/2018)  . [DISCONTINUED] diazepam (VALIUM) 5 MG tablet 1-2  tabs 1 hr prior to mri   No facility-administered encounter medications on file as of 11/15/2018.     Allergies (verified) Erythromycin; Meperidine; and Meperidine hcl   History: Past Medical History:  Diagnosis Date  . Anemia   . Arthritis   . Blood transfusion    for GI bleed 1987  . Diabetes mellitus   . High cholesterol   . Hypertension   . Thyroid disease    Past Surgical History:  Procedure Laterality Date  . BREAST BIOPSY    . LUMBAR DISC SURGERY  10/26/2012  . rt knee arthroscopy  05/2008  . THYROIDECTOMY, PARTIAL  05/2002   Family History  Problem Relation Age of Onset  . Colon cancer Mother   . High blood pressure Mother   . Breast cancer Mother   .  High blood pressure Father   . Heart attack Father   . Heart attack Maternal Grandfather   . Stroke Maternal Grandfather   . Stroke Paternal Grandfather    Social History   Socioeconomic History  . Marital status: Married    Spouse name: Rush Landmark  . Number of children: 2  . Years of education: 23  . Highest education level: Associate degree: academic program  Occupational History  . 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 Financial resource strain: Not hard at all  . Food insecurity:    Worry: Never true    Inability: Never true  . Transportation needs:    Medical: No    Non-medical: No  Tobacco Use  . Smoking status: Former Smoker    Last attempt to quit: 09/09/1986    Years since quitting: 32.2  . Smokeless tobacco: Never Used  Substance and Sexual Activity  . Alcohol use: Not Currently  . Drug use: No  . Sexual activity: Not Currently    Partners: Male    Birth control/protection: Abstinence  Lifestyle  . Physical activity:    Days per week: 0 days    Minutes per session: 0 min  . Stress: Not at all  Relationships  . Social connections:    Talks on phone: More than three times a week    Gets together: Once a week    Attends religious service: Never    Active member of club or organization: No    Attends meetings of clubs or organizations: Never    Relationship status: Married  Other Topics Concern  . Not on file  Social History Narrative   Housework, takes care of mother in nursing home, shopping    Tobacco Counseling Counseling given: Not Answered   Clinical Intake:  Pre-visit preparation completed: Yes  Pain : No/denies pain     Nutritional Risks: None Diabetes: No  How often do you need to have someone help you when you read instructions, pamphlets, or other written materials from your doctor or pharmacy?: 1 - Never What is the last grade level you completed in school?: 15  Interpreter Needed?: No  Information  entered by :: Orlie Dakin, LPN   Activities of Daily Living In your present state of health, do you have any difficulty performing the following activities: 11/15/2018  Hearing? N  Vision? N  Difficulty concentrating or making decisions? N  Walking or climbing stairs? Y  Comment with knee and back problems it makes it difficult  Dressing or bathing? N  Doing errands, shopping? N  Preparing Food and eating ? N  Using the Toilet? N  In the past six months, have you accidently leaked urine? Y  Comment notices lately with sneezing or cough  Do you have problems with loss of bowel control? N  Managing your Medications? N  Managing your Finances? N  Housekeeping or managing your Housekeeping? N  Some recent data might be hidden     Immunizations and Health Maintenance Immunization History  Administered Date(s) Administered  . Influenza Inj Mdck Quad Pf 04/06/2018  . Influenza Whole 03/24/2010, 05/05/2012  . Influenza, High Dose Seasonal PF 06/13/2013, 03/07/2015, 04/12/2017  . Influenza,inj,Quad PF,6+ Mos 04/23/2014  . Influenza-Unspecified 05/11/2011  . Pneumococcal Conjugate-13 09/01/2013  . Pneumococcal Polysaccharide-23 08/05/2009  . Td 08/05/2009  . Zoster 09/01/2012   Health Maintenance Due  Topic Date Due  . PNA vac Low Risk Adult (2 of 2 - PPSV23) 09/02/2014  . FOOT EXAM  11/17/2017  . OPHTHALMOLOGY EXAM  03/22/2018    Patient Care Team: Emeterio Reeve, DO as PCP - General (Osteopathic Medicine) Ladene Artist, MD as Attending Physician (Gastroenterology) Luetta Nutting, MD as Referring Physician (Orthopedic Surgery) Emily Filbert, MD as Attending Physician (Obstetrics and Gynecology) Rutherford Guys, MD as Consulting Physician (Ophthalmology)  Indicate any recent Medical Services you may have received from other than [facility] providers in the past year (date may be approximate).     Assessment:   This is a routine wellness examination for Bettejane.Physical  assessment deferred to PCP.   Hearing/Vision screen Hearing Screening Comments: Hearing test not done due to over the phone visit to [place]  Dietary issues and exercise activities discussed: Current Exercise Habits: The patient does not participate in regular exercise at present, Exercise limited by: None identified Diet eats a healthy diet full of vegetables, fruits, salads. Breakfast: Bagel with cream cheese or banana. Eggs and bacon and toast. Lunch: brunch breakfast and lunch is together Dinner: Meat and vegetables, potatoes, salad or fruit with this.      Goals    . Patient Stated     Patient stated would like to loose 5lbs. Would like to start walking again.        Depression Screen PHQ 2/9 Scores 11/15/2018 05/03/2018  PHQ - 2 Score 1 2  PHQ- 9 Score - 4    Fall Risk Fall Risk  11/15/2018  Falls in the past year? 0  Follow up Falls prevention discussed    Is the patient's home free of loose throw rugs in walkways, pet beds, electrical cords, etc?   yes      Grab bars in the bathroom? yes      Handrails on the stairs?   no      Adequate lighting?   yes  Cognitive Function:     6CIT Screen 11/15/2018  What Year? 0 points  What month? 0 points  What time? 0 points  Count back from 20 0 points  Months in reverse 0 points  Repeat phrase 0 points  Total Score 0    Screening Tests Health Maintenance  Topic Date Due  . PNA vac Low Risk Adult (2 of 2 - PPSV23) 09/02/2014  . FOOT EXAM  11/17/2017  . OPHTHALMOLOGY EXAM  03/22/2018  . INFLUENZA VACCINE  01/21/2019  . HEMOGLOBIN A1C  02/07/2019  . TETANUS/TDAP  08/06/2019  . MAMMOGRAM  02/03/2020  . COLONOSCOPY  09/22/2020  . DEXA SCAN  Completed  . Hepatitis C Screening  Completed        Plan:    Please schedule your next medicare  wellness visit with me in 1 yr.  Ms. Diaz , Thank you for taking time to come for your Medicare Wellness Visit. I appreciate your ongoing commitment to your health goals.  Please review the following plan we discussed and let me know if I can assist you in the future.  Continue doing brain stimulating activities (puzzles, reading, adult coloring books, staying active) to keep memory sharp.  Bring a copy of your living will and/or healthcare power of attorney to your next office visit.   These are the goals we discussed: Goals    . Patient Stated     Patient stated would like to loose 5lbs. Would like to start walking again.         This is a list of the screening recommended for you and due dates:  Health Maintenance  Topic Date Due  . Pneumonia vaccines (2 of 2 - PPSV23) 09/02/2014  . Complete foot exam   11/17/2017  . Eye exam for diabetics  03/22/2018  . Flu Shot  01/21/2019  . Hemoglobin A1C  02/07/2019  . Tetanus Vaccine  08/06/2019  . Mammogram  02/03/2020  . Colon Cancer Screening  09/22/2020  . DEXA scan (bone density measurement)  Completed  .  Hepatitis C: One time screening is recommended by Center for Disease Control  (CDC) for  adults born from [birth year] through 1966.   Completed      I have personally reviewed and noted the following in the patient's chart:   . Medical and social history . Use of alcohol, tobacco or illicit drugs  . Current medications and supplements . Functional ability and status . Nutritional status . Physical activity . Advanced directives . List of other physicians . Hospitalizations, surgeries, and ER visits in previous 12 months . Vitals . Screenings to include cognitive, depression, and falls . Referrals and appointments  In addition, I have reviewed and discussed with patient certain preventive protocols, quality metrics, and best practice recommendations. A written personalized care plan for preventive services as well as general preventive health recommendations were provided to patient.     Joanne Chars, LPN   07/24/5425

## 2018-11-18 ENCOUNTER — Other Ambulatory Visit: Payer: Self-pay | Admitting: Osteopathic Medicine

## 2018-12-07 DIAGNOSIS — M1711 Unilateral primary osteoarthritis, right knee: Secondary | ICD-10-CM | POA: Diagnosis not present

## 2018-12-07 DIAGNOSIS — Z96651 Presence of right artificial knee joint: Secondary | ICD-10-CM | POA: Diagnosis not present

## 2018-12-07 DIAGNOSIS — M25561 Pain in right knee: Secondary | ICD-10-CM | POA: Diagnosis not present

## 2018-12-15 ENCOUNTER — Other Ambulatory Visit: Payer: Self-pay | Admitting: Endocrinology

## 2018-12-15 ENCOUNTER — Other Ambulatory Visit: Payer: Self-pay | Admitting: Osteopathic Medicine

## 2018-12-15 DIAGNOSIS — Z1231 Encounter for screening mammogram for malignant neoplasm of breast: Secondary | ICD-10-CM

## 2018-12-15 NOTE — Telephone Encounter (Signed)
Please forward refill request to pt's new primary care provider.  

## 2018-12-15 NOTE — Telephone Encounter (Signed)
Please review and advise if refill is appropriate

## 2018-12-30 ENCOUNTER — Encounter: Payer: Self-pay | Admitting: Osteopathic Medicine

## 2018-12-30 ENCOUNTER — Other Ambulatory Visit: Payer: Self-pay | Admitting: *Deleted

## 2018-12-30 MED ORDER — LEVOTHYROXINE SODIUM 25 MCG PO TABS: 25.0000 ug | ORAL_TABLET | Freq: Every day | ORAL | 0 refills | Status: DC

## 2019-02-02 ENCOUNTER — Other Ambulatory Visit: Payer: Self-pay | Admitting: Osteopathic Medicine

## 2019-02-06 ENCOUNTER — Ambulatory Visit (HOSPITAL_BASED_OUTPATIENT_CLINIC_OR_DEPARTMENT_OTHER)
Admission: RE | Admit: 2019-02-06 | Discharge: 2019-02-06 | Disposition: A | Discharge status: Home / Self Care | Payer: Medicare Other | Source: Ambulatory Visit | Attending: Endocrinology | Admitting: Endocrinology

## 2019-02-06 ENCOUNTER — Other Ambulatory Visit: Payer: Self-pay

## 2019-02-06 DIAGNOSIS — Z1231 Encounter for screening mammogram for malignant neoplasm of breast: Secondary | ICD-10-CM | POA: Diagnosis not present

## 2019-02-17 ENCOUNTER — Other Ambulatory Visit: Payer: Self-pay | Admitting: Osteopathic Medicine

## 2019-02-17 DIAGNOSIS — E039 Hypothyroidism, unspecified: Secondary | ICD-10-CM | POA: Diagnosis not present

## 2019-02-17 NOTE — Telephone Encounter (Signed)
Pt returned a call back regarding clarification on provider's note. Pt has been updated. As per pt, she would like provider to hold off on sending levothyroxine. Pt will complete lab order today or tomorrow.

## 2019-02-17 NOTE — Telephone Encounter (Signed)
Forwarding medication refill request to PCP for review. 

## 2019-02-17 NOTE — Telephone Encounter (Signed)
Attempted to contact pt regarding med refill and provider's note. No answer, left a detailed vm msg. Pt is aware that refill is pending/ to be sent to local pharmacy and to complete lab testing for thyroid check. Direct call back info provided.

## 2019-02-17 NOTE — Telephone Encounter (Signed)
15 days of refill sent but patient NEEDS LABS before additional refills will be authorized. Rx was changed back in February and needs labs to follow up. Orders are in   Rx pended in case she wants it sent locally?

## 2019-02-18 ENCOUNTER — Other Ambulatory Visit: Payer: Self-pay | Admitting: Osteopathic Medicine

## 2019-02-18 LAB — TSH: TSH: 1.36 mIU/L (ref 0.40–4.50)

## 2019-02-18 MED ORDER — LEVOTHYROXINE SODIUM 25 MCG PO TABS: 25.0000 ug | ORAL_TABLET | Freq: Every day | ORAL | 3 refills | Status: DC

## 2019-02-24 ENCOUNTER — Encounter: Payer: Self-pay | Admitting: Osteopathic Medicine

## 2019-02-28 MED ORDER — RALOXIFENE HCL 60 MG PO TABS: 60.0000 mg | ORAL_TABLET | Freq: Every day | ORAL | 3 refills | Status: DC

## 2019-03-07 ENCOUNTER — Ambulatory Visit (INDEPENDENT_AMBULATORY_CARE_PROVIDER_SITE_OTHER): Payer: Medicare Other | Admitting: Osteopathic Medicine

## 2019-03-07 DIAGNOSIS — Z23 Encounter for immunization: Secondary | ICD-10-CM

## 2019-04-09 ENCOUNTER — Other Ambulatory Visit: Payer: Self-pay | Admitting: Osteopathic Medicine

## 2019-05-02 DIAGNOSIS — Z8639 Personal history of other endocrine, nutritional and metabolic disease: Secondary | ICD-10-CM | POA: Diagnosis not present

## 2019-05-02 DIAGNOSIS — Z961 Presence of intraocular lens: Secondary | ICD-10-CM | POA: Diagnosis not present

## 2019-05-02 LAB — HM DIABETES EYE EXAM

## 2019-06-06 ENCOUNTER — Other Ambulatory Visit: Payer: Self-pay | Admitting: Osteopathic Medicine

## 2019-06-06 NOTE — Telephone Encounter (Signed)
Needs appointment

## 2019-06-07 ENCOUNTER — Encounter: Payer: Self-pay | Admitting: Osteopathic Medicine

## 2019-06-07 ENCOUNTER — Other Ambulatory Visit: Payer: Self-pay

## 2019-06-07 MED ORDER — LOSARTAN POTASSIUM 100 MG PO TABS: 100.0000 mg | ORAL_TABLET | Freq: Every day | ORAL | 0 refills | Status: DC

## 2019-06-09 ENCOUNTER — Other Ambulatory Visit: Payer: Self-pay

## 2019-06-09 NOTE — Patient Outreach (Signed)
[facility]) Care Management  06/09/2019  Adesola Duprey Blancett [birth year] [id number]   Medication Adherence call to Mrs. Manna Wissinger [employer] spoke with patient she is past due on Lovastatin 40 mg ,patient explain she takes 1 tablet daily ,patient explain she has medication at this time and will order when due. Patient did not want any help or assistance. Mrs. Bessinger is showing past due under [place].   [facility] Management Direct Dial [phone]  Fax [phone] [email or web address]

## 2019-08-03 ENCOUNTER — Ambulatory Visit: Payer: Medicare Other

## 2019-08-10 ENCOUNTER — Other Ambulatory Visit: Payer: Self-pay | Admitting: Osteopathic Medicine

## 2019-08-30 ENCOUNTER — Other Ambulatory Visit: Payer: Self-pay | Admitting: Osteopathic Medicine

## 2019-08-30 NOTE — Telephone Encounter (Signed)
Needs appointment

## 2019-09-21 ENCOUNTER — Other Ambulatory Visit: Payer: Self-pay | Admitting: Osteopathic Medicine

## 2019-09-26 ENCOUNTER — Other Ambulatory Visit: Payer: Self-pay | Admitting: Osteopathic Medicine

## 2019-10-02 ENCOUNTER — Other Ambulatory Visit: Payer: Self-pay | Admitting: Osteopathic Medicine

## 2019-10-04 ENCOUNTER — Encounter: Payer: Self-pay | Admitting: Osteopathic Medicine

## 2019-10-04 ENCOUNTER — Ambulatory Visit (INDEPENDENT_AMBULATORY_CARE_PROVIDER_SITE_OTHER): Payer: Medicare Other | Admitting: Osteopathic Medicine

## 2019-10-04 ENCOUNTER — Other Ambulatory Visit: Payer: Self-pay

## 2019-10-04 VITALS — BP 140/87 | HR 71 | Wt 164.0 lb

## 2019-10-04 DIAGNOSIS — R7303 Prediabetes: Secondary | ICD-10-CM | POA: Diagnosis not present

## 2019-10-04 DIAGNOSIS — E890 Postprocedural hypothyroidism: Secondary | ICD-10-CM | POA: Diagnosis not present

## 2019-10-04 DIAGNOSIS — M25551 Pain in right hip: Secondary | ICD-10-CM

## 2019-10-04 DIAGNOSIS — I10 Essential (primary) hypertension: Secondary | ICD-10-CM

## 2019-10-04 DIAGNOSIS — Z0000 Encounter for general adult medical examination without abnormal findings: Secondary | ICD-10-CM | POA: Diagnosis not present

## 2019-10-04 DIAGNOSIS — E876 Hypokalemia: Secondary | ICD-10-CM | POA: Diagnosis not present

## 2019-10-04 DIAGNOSIS — M48061 Spinal stenosis, lumbar region without neurogenic claudication: Secondary | ICD-10-CM

## 2019-10-04 DIAGNOSIS — M8580 Other specified disorders of bone density and structure, unspecified site: Secondary | ICD-10-CM

## 2019-10-04 MED ORDER — MELOXICAM 7.5 MG PO TABS: 7.5000 mg | ORAL_TABLET | Freq: Every day | ORAL | 3 refills | Status: DC

## 2019-10-04 MED ORDER — LOVASTATIN 40 MG PO TABS: 40.0000 mg | ORAL_TABLET | Freq: Two times a day (BID) | ORAL | 3 refills | Status: DC

## 2019-10-04 MED ORDER — LOSARTAN POTASSIUM-HCTZ 100-25 MG PO TABS: 1.0000 | ORAL_TABLET | Freq: Every day | ORAL | 3 refills | Status: DC

## 2019-10-04 NOTE — Progress Notes (Signed)
HPI: [name] is a 74 y.o. female who  has a past medical history of Anemia, Arthritis, Blood transfusion, Diabetes mellitus, High cholesterol, Hypertension, and Thyroid disease.  she presents to [facility] today, 10/04/19,  for chief complaint of: Annual physical     Patient here for annual physical / wellness exam.  See preventive care reviewed as below.   Additional concerns today include:  None     Past medical, surgical, social and family history reviewed:  Patient Active Problem List   Diagnosis Date Noted  . S/P right unicompartmental knee replacement 09/21/2016  . Hyperglycemia 04/24/2014  . Hypokalemia 09/02/2013  . Routine general medical examination at a health care facility 09/01/2013  . Spinal stenosis, lumbar 01/12/2012  . Low back pain radiating to right leg 12/28/2011  . Encounter for long-term (current) use of other medications 08/13/2011  . 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 Numbness 08/13/2011  . HYPERCHOLESTEROLEMIA 08/11/2010  . Asymptomatic postmenopausal status 08/03/2008  . Anemia 08/03/2007  . NONSPECIFIC ABNORMAL ELECTROCARDIOGRAM 08/03/2007  . Essential hypertension 02/19/2007  . ALLERGIC RHINITIS 02/19/2007  . Arthritis 02/19/2007    Past Surgical History:  Procedure Laterality Date  . BREAST BIOPSY    . LUMBAR DISC SURGERY  10/26/2012  . rt knee arthroscopy  05/2008  . THYROIDECTOMY, PARTIAL  05/2002    Social History   Tobacco Use  . Smoking status: Former Smoker    Quit date: 09/09/1986    Years since quitting: 33.0  . Smokeless tobacco: Never Used  Substance Use Topics  . Alcohol use: Not Currently    Family History  Problem Relation Age of Onset  . Colon cancer Mother   . High blood pressure Mother   . Breast cancer Mother   . High blood pressure Father   . Heart attack Father   . Heart attack Maternal Grandfather   . Stroke Maternal Grandfather   . Stroke Paternal  Grandfather      Current medication list and allergy/intolerance information reviewed:    Current Outpatient Medications  Medication Sig Dispense Refill  . amLODipine (NORVASC) 5 MG tablet TAKE 1 TABLET BY MOUTH  DAILY 90 tablet 3  . calcium carbonate (TUMS - DOSED IN MG ELEMENTAL CALCIUM) 500 MG chewable tablet Chew 1 tablet by mouth 2 (two) times daily.    . carvedilol (COREG) 3.125 MG tablet TAKE ONE-HALF TABLET BY  MOUTH TWICE DAILY WITH  MEALS 90 tablet 3  . cholecalciferol (VITAMIN D) 1000 UNITS tablet Take 1,000 Units by mouth daily.    . ferrous sulfate 325 (65 FE) MG tablet Take 325 mg by mouth daily with breakfast.    . levothyroxine (SYNTHROID) 25 MCG tablet Take 1 tablet (25 mcg total) by mouth daily before breakfast. 90 tablet 3  . lovastatin (MEVACOR) 40 MG tablet Take 1 tablet (40 mg total) by mouth 2 (two) times daily. 180 tablet 3  . Melatonin 5 MG TBDP Take by mouth. Take one by mouth at bedtime    . meloxicam (MOBIC) 7.5 MG tablet Take 1-2 tablets (7.5-15 mg total) by mouth daily. As needed for aches/pains and arthritis 180 tablet 3  . Potassium Chloride CR (MICRO-K) 8 MEQ CPCR capsule CR TAKE 2 CAPSULES BY MOUTH  TWICE DAILY 360 capsule 1  . raloxifene (EVISTA) 60 MG tablet Take 1 tablet (60 mg total) by mouth daily. 90 tablet 3  . losartan-hydrochlorothiazide (HYZAAR) 100-25 MG tablet Take 1 tablet by mouth daily. 90 tablet  3   No current facility-administered medications for this visit.    Allergies  Allergen Reactions  . Erythromycin     REACTION: Rash  . Meperidine Other (See Comments)    Gi upset  . Meperidine Hcl     REACTION: Nausea      Review of Systems:  Constitutional:  No  fever, no chills, No recent illness  HEENT: No  headache, no vision change  Cardiac: No  chest pain, No  pressure, No palpitations  Respiratory:  No  shortness of breath. No  Cough  Gastrointestinal: No  abdominal pain, No  nausea  Musculoskeletal: No new  myalgia/arthralgia  Skin: No  Rash, No other wounds/concerning lesions  Genitourinary: Urgency but no incontinence, No  abnormal genital bleeding, No abnormal genital discharge  Endocrine: No cold intolerance,  No heat intolerance.  Neurologic: No  weakness, No  dizziness  Psychiatric: No  concerns with depression, No  concerns with anxiety    Exam:  BP 140/87 (BP Location: Left Arm, Patient Position: Sitting, Cuff Size: Normal)   Pulse 71   Wt 164 lb (74.4 kg)   SpO2 93%   BMI 29.05 kg/m   Constitutional: VS see above. General Appearance: alert, well-developed, well-nourished, NAD  Eyes: Normal lids and conjunctive, non-icteric sclera  Ears, Nose, Mouth, Throat: TM normal bilaterally.  Neck: No masses, trachea midline. No thyroid enlargement. No tenderness/mass appreciated. No lymphadenopathy  Respiratory: Normal respiratory effort. no wheeze, no rhonchi, no rales  Cardiovascular: S1/S2 normal, no murmur, no rub/gallop auscultated. RRR. No lower extremity edema.   Gastrointestinal: Nontender, no masses. No hepatomegaly, no splenomegaly. No hernia appreciated. Bowel sounds normal. Rectal exam deferred.   Musculoskeletal: Gait normal. No clubbing/cyanosis of digits.   Neurological: Normal balance/coordination. No tremor.  Skin: warm, dry, intact. No rash/ulcer   Psychiatric: Normal judgment/insight. Normal mood and affect. Oriented x3.    No results found for this or any previous visit (from the past 72 hour(s)).  No results found.        ASSESSMENT/PLAN: The primary encounter diagnosis was Annual physical exam. Diagnoses of Right hip pain, Prediabetes, Postsurgical hypothyroidism, Hypokalemia, Spinal stenosis of lumbar region, unspecified whether neurogenic claudication present, and Essential hypertension were also pertinent to this visit.  Orders Placed This Encounter  Procedures  . CBC  . COMPLETE METABOLIC PANEL WITH GFR  . Lipid panel  . TSH  .  Hemoglobin A1c    Meds ordered this encounter  Medications  . meloxicam (MOBIC) 7.5 MG tablet    Sig: Take 1-2 tablets (7.5-15 mg total) by mouth daily. As needed for aches/pains and arthritis    Dispense:  180 tablet    Refill:  3  . lovastatin (MEVACOR) 40 MG tablet    Sig: Take 1 tablet (40 mg total) by mouth 2 (two) times daily.    Dispense:  180 tablet    Refill:  3  . losartan-hydrochlorothiazide (HYZAAR) 100-25 MG tablet    Sig: Take 1 tablet by mouth daily.    Dispense:  90 tablet    Refill:  3    Patient Instructions  General Preventive Care  Most recent routine screening labs: ordered.   Everyone should have blood pressure checked once per year. Goal 140/90 or less.   Tobacco: don't!   Alcohol: responsible moderation is ok for most adults - if you have concerns about your alcohol intake, please talk to me!   Exercise: as tolerated to reduce risk of cardiovascular disease and diabetes.  Strength training will also prevent osteoporosis.   Mental health: if need for mental health care (medicines, counseling, other), or concerns about moods, please let me know!   Sexual health: if need for STD testing, or if concerns with libido/pain problems, please let me know!  Advanced Directive: Living Will and/or Healthcare Power of Attorney recommended for all adults, regardless of age or health.  Vaccines  Flu vaccine: for almost everyone, every fall.   Shingles vaccine: Looks like Zostavax was done, you're eligible for Shingrix if desired, please ask your pharmacist! ([employer] won't pay for it here)   Pneumonia vaccines: after age 55 - all done!  Tetanus booster: every 10 years, please ask your pharmacist! [facility] won't pay for it here)   COVID vaccine: thanks for taking your vaccine! Phebe Colla!  Cancer screenings   Colon cancer screening: for everyone age 49-75. Colonoscopy next year then hopefully done!   Breast cancer screening: mammogram age 45-75. Due  01/2020.  Cervical cancer screening: Pap usually stop at age 69 or w/ hysterectomy.   Lung cancer screening: not needed  Infection screenings  . HIV: recommended screening at least once age 96-65 . Gonorrhea/Chlamydia: screening as needed . Hepatitis C: recommended once for everyone age 73-75, done 2017! . TB: certain at-risk populations Other . Bone Density Test: every 2 years or so, I ordered this!       Visit summary with medication list and pertinent instructions was printed for patient to review. All questions at time of visit were answered - patient instructed to contact office with any additional concerns or updates. ER/RTC precautions were reviewed with the patient.      Please note: voice recognition software was used to produce this document, and typos may escape review. Please contact Dr. Sheppard Coil for any needed clarifications.     Follow-up plan: Return in about 1 year (around 10/03/2020) for ANNUAL (call week prior to visit for lab orders), RECHECK PENDING LAB RESULTS / AS NEEDED.

## 2019-10-04 NOTE — Patient Instructions (Signed)
General Preventive Care  Most recent routine screening labs: ordered.   Everyone should have blood pressure checked once per year. Goal 140/90 or less.   Tobacco: don't!   Alcohol: responsible moderation is ok for most adults - if you have concerns about your alcohol intake, please talk to me!   Exercise: as tolerated to reduce risk of cardiovascular disease and diabetes. Strength training will also prevent osteoporosis.   Mental health: if need for mental health care (medicines, counseling, other), or concerns about moods, please let me know!   Sexual health: if need for STD testing, or if concerns with libido/pain problems, please let me know!  Advanced Directive: Living Will and/or Healthcare Power of Attorney recommended for all adults, regardless of age or health.  Vaccines  Flu vaccine: for almost everyone, every fall.   Shingles vaccine: Looks like Zostavax was done, you're eligible for Shingrix if desired, please ask your pharmacist! ([employer] won't pay for it here)   Pneumonia vaccines: after age 86 - all done!  Tetanus booster: every 10 years, please ask your pharmacist! [facility] won't pay for it here)   COVID vaccine: thanks for taking your vaccine! Phebe Colla!  Cancer screenings   Colon cancer screening: for everyone age 41-75. Colonoscopy next year then hopefully done!   Breast cancer screening: mammogram age 85-75. Due 01/2020.  Cervical cancer screening: Pap usually stop at age 65 or w/ hysterectomy.   Lung cancer screening: not needed  Infection screenings  . HIV: recommended screening at least once age 42-65 . Gonorrhea/Chlamydia: screening as needed . Hepatitis C: recommended once for everyone age 17-75, done 2017! . TB: certain at-risk populations Other . Bone Density Test: every 2 years or so, I ordered this!

## 2019-10-05 LAB — COMPLETE METABOLIC PANEL WITH GFR
AG Ratio: 2.1 (calc) (ref 1.0–2.5)
ALT: 11 U/L (ref 6–29)
AST: 19 U/L (ref 10–35)
Albumin: 4.4 g/dL (ref 3.6–5.1)
Alkaline phosphatase (APISO): 44 U/L (ref 37–153)
BUN: 12 mg/dL (ref 7–25)
CO2: 29 mmol/L (ref 20–32)
Calcium: 9.4 mg/dL (ref 8.6–10.4)
Chloride: 103 mmol/L (ref 98–110)
Creat: 0.74 mg/dL (ref 0.60–0.93)
GFR, Est African American: 92 mL/min/{1.73_m2} (ref >=60)
GFR, Est Non African American: 80 mL/min/{1.73_m2} (ref >=60)
Globulin: 2.1 g/dL (calc) (ref 1.9–3.7)
Glucose, Bld: 98 mg/dL (ref 65–139)
Potassium: 4.4 mmol/L (ref 3.5–5.3)
Sodium: 140 mmol/L (ref 135–146)
Total Bilirubin: 0.4 mg/dL (ref 0.2–1.2)
Total Protein: 6.5 g/dL (ref 6.1–8.1)

## 2019-10-05 LAB — CBC
HCT: 37.9 % (ref 35.0–45.0)
Hemoglobin: 12.9 g/dL (ref 11.7–15.5)
MCH: 31.2 pg (ref 27.0–33.0)
MCHC: 34 g/dL (ref 32.0–36.0)
MCV: 91.8 fL (ref 80.0–100.0)
MPV: 9.9 fL (ref 7.5–12.5)
Platelets: 320 10*3/uL (ref 140–400)
RBC: 4.13 10*6/uL (ref 3.80–5.10)
RDW: 13 % (ref 11.0–15.0)
WBC: 6.5 10*3/uL (ref 3.8–10.8)

## 2019-10-05 LAB — LIPID PANEL
Cholesterol: 175 mg/dL (ref <200)
HDL: 80 mg/dL (ref >=50)
LDL Cholesterol (Calc): 74 mg/dL (calc)
Non-HDL Cholesterol (Calc): 95 mg/dL (calc) (ref <130)
Total CHOL/HDL Ratio: 2.2 (calc) (ref <5.0)
Triglycerides: 129 mg/dL (ref <150)

## 2019-10-05 LAB — TSH: TSH: 1.2 mIU/L (ref 0.40–4.50)

## 2019-10-05 LAB — HEMOGLOBIN A1C
Hgb A1c MFr Bld: 5.8 % of total Hgb — ABNORMAL HIGH (ref <5.7)
Mean Plasma Glucose: 120 (calc)
eAG (mmol/L): 6.6 (calc)

## 2019-10-11 ENCOUNTER — Encounter: Payer: Self-pay | Admitting: Osteopathic Medicine

## 2019-11-08 ENCOUNTER — Other Ambulatory Visit: Payer: Self-pay

## 2019-11-08 ENCOUNTER — Ambulatory Visit (INDEPENDENT_AMBULATORY_CARE_PROVIDER_SITE_OTHER): Payer: Medicare Other

## 2019-11-08 DIAGNOSIS — M8580 Other specified disorders of bone density and structure, unspecified site: Secondary | ICD-10-CM | POA: Diagnosis not present

## 2019-11-08 DIAGNOSIS — M8589 Other specified disorders of bone density and structure, multiple sites: Secondary | ICD-10-CM | POA: Diagnosis not present

## 2019-11-08 DIAGNOSIS — Z780 Asymptomatic menopausal state: Secondary | ICD-10-CM | POA: Diagnosis not present

## 2019-11-08 DIAGNOSIS — Z0000 Encounter for general adult medical examination without abnormal findings: Secondary | ICD-10-CM | POA: Diagnosis not present

## 2019-12-29 ENCOUNTER — Other Ambulatory Visit: Payer: Self-pay | Admitting: Osteopathic Medicine

## 2019-12-29 DIAGNOSIS — Z1231 Encounter for screening mammogram for malignant neoplasm of breast: Secondary | ICD-10-CM

## 2020-01-14 ENCOUNTER — Other Ambulatory Visit: Payer: Self-pay | Admitting: Osteopathic Medicine

## 2020-02-14 ENCOUNTER — Other Ambulatory Visit: Payer: Self-pay

## 2020-02-14 ENCOUNTER — Ambulatory Visit (INDEPENDENT_AMBULATORY_CARE_PROVIDER_SITE_OTHER): Payer: Medicare Other

## 2020-02-14 DIAGNOSIS — Z1231 Encounter for screening mammogram for malignant neoplasm of breast: Secondary | ICD-10-CM | POA: Diagnosis not present

## 2020-03-07 ENCOUNTER — Ambulatory Visit (INDEPENDENT_AMBULATORY_CARE_PROVIDER_SITE_OTHER): Payer: Medicare Other | Admitting: Osteopathic Medicine

## 2020-03-07 VITALS — BP 150/63 | HR 68

## 2020-03-07 DIAGNOSIS — Z23 Encounter for immunization: Secondary | ICD-10-CM | POA: Diagnosis not present

## 2020-03-07 NOTE — Progress Notes (Signed)
Patient is here for a flu vaccine. Verified no previous allergy to flu vaccine, eggs, or latex. Flu injection to left deltoid with no apparent complications. Patient advised to call with any problems.   

## 2020-05-01 DIAGNOSIS — H04123 Dry eye syndrome of bilateral lacrimal glands: Secondary | ICD-10-CM | POA: Diagnosis not present

## 2020-05-01 DIAGNOSIS — H02831 Dermatochalasis of right upper eyelid: Secondary | ICD-10-CM | POA: Diagnosis not present

## 2020-05-01 DIAGNOSIS — H52203 Unspecified astigmatism, bilateral: Secondary | ICD-10-CM | POA: Diagnosis not present

## 2020-05-01 DIAGNOSIS — H524 Presbyopia: Secondary | ICD-10-CM | POA: Diagnosis not present

## 2020-05-01 DIAGNOSIS — Z961 Presence of intraocular lens: Secondary | ICD-10-CM | POA: Diagnosis not present

## 2020-07-01 DIAGNOSIS — H02413 Mechanical ptosis of bilateral eyelids: Secondary | ICD-10-CM | POA: Diagnosis not present

## 2020-07-01 DIAGNOSIS — H53483 Generalized contraction of visual field, bilateral: Secondary | ICD-10-CM | POA: Diagnosis not present

## 2020-07-01 DIAGNOSIS — H02423 Myogenic ptosis of bilateral eyelids: Secondary | ICD-10-CM | POA: Diagnosis not present

## 2020-07-01 DIAGNOSIS — H02132 Senile ectropion of right lower eyelid: Secondary | ICD-10-CM | POA: Diagnosis not present

## 2020-07-01 DIAGNOSIS — H02135 Senile ectropion of left lower eyelid: Secondary | ICD-10-CM | POA: Diagnosis not present

## 2020-07-01 DIAGNOSIS — H0279 Other degenerative disorders of eyelid and periocular area: Secondary | ICD-10-CM | POA: Diagnosis not present

## 2020-07-02 DIAGNOSIS — H53482 Generalized contraction of visual field, left eye: Secondary | ICD-10-CM | POA: Diagnosis not present

## 2020-07-02 DIAGNOSIS — H53483 Generalized contraction of visual field, bilateral: Secondary | ICD-10-CM | POA: Diagnosis not present

## 2020-07-02 DIAGNOSIS — H53481 Generalized contraction of visual field, right eye: Secondary | ICD-10-CM | POA: Diagnosis not present

## 2020-07-04 ENCOUNTER — Other Ambulatory Visit: Payer: Self-pay | Admitting: Osteopathic Medicine

## 2020-09-18 ENCOUNTER — Other Ambulatory Visit: Payer: Self-pay | Admitting: Osteopathic Medicine

## 2020-10-01 ENCOUNTER — Telehealth: Payer: Self-pay | Admitting: *Deleted

## 2020-10-01 ENCOUNTER — Encounter: Payer: Self-pay | Admitting: Osteopathic Medicine

## 2020-10-01 DIAGNOSIS — I10 Essential (primary) hypertension: Secondary | ICD-10-CM

## 2020-10-01 DIAGNOSIS — R7303 Prediabetes: Secondary | ICD-10-CM

## 2020-10-01 DIAGNOSIS — E7800 Pure hypercholesterolemia, unspecified: Secondary | ICD-10-CM

## 2020-10-01 DIAGNOSIS — Z0189 Encounter for other specified special examinations: Secondary | ICD-10-CM

## 2020-10-01 DIAGNOSIS — E890 Postprocedural hypothyroidism: Secondary | ICD-10-CM

## 2020-10-01 NOTE — Telephone Encounter (Signed)
Labs ordered per pt request for upcoming physical.

## 2020-10-05 LAB — CBC
HCT: 38.5 % (ref 35.0–45.0)
Hemoglobin: 12.9 g/dL (ref 11.7–15.5)
MCH: 31 pg (ref 27.0–33.0)
MCHC: 33.5 g/dL (ref 32.0–36.0)
MCV: 92.5 fL (ref 80.0–100.0)
MPV: 9.8 fL (ref 7.5–12.5)
Platelets: 303 10*3/uL (ref 140–400)
RBC: 4.16 10*6/uL (ref 3.80–5.10)
RDW: 13.5 % (ref 11.0–15.0)
WBC: 6.2 10*3/uL (ref 3.8–10.8)

## 2020-10-05 LAB — LIPID PANEL W/REFLEX DIRECT LDL
Cholesterol: 199 mg/dL (ref <200)
HDL: 98 mg/dL (ref >=50)
LDL Cholesterol (Calc): 83 mg/dL (calc)
Non-HDL Cholesterol (Calc): 101 mg/dL (calc) (ref <130)
Total CHOL/HDL Ratio: 2 (calc) (ref <5.0)
Triglycerides: 87 mg/dL (ref <150)

## 2020-10-05 LAB — COMPLETE METABOLIC PANEL WITH GFR
AG Ratio: 1.9 (calc) (ref 1.0–2.5)
ALT: 13 U/L (ref 6–29)
AST: 17 U/L (ref 10–35)
Albumin: 4.2 g/dL (ref 3.6–5.1)
Alkaline phosphatase (APISO): 45 U/L (ref 37–153)
BUN: 12 mg/dL (ref 7–25)
CO2: 30 mmol/L (ref 20–32)
Calcium: 9.7 mg/dL (ref 8.6–10.4)
Chloride: 103 mmol/L (ref 98–110)
Creat: 0.77 mg/dL (ref 0.60–0.93)
GFR, Est African American: 88 mL/min/{1.73_m2} (ref >=60)
GFR, Est Non African American: 76 mL/min/{1.73_m2} (ref >=60)
Globulin: 2.2 g/dL (calc) (ref 1.9–3.7)
Glucose, Bld: 110 mg/dL — ABNORMAL HIGH (ref 65–99)
Potassium: 3.8 mmol/L (ref 3.5–5.3)
Sodium: 141 mmol/L (ref 135–146)
Total Bilirubin: 0.4 mg/dL (ref 0.2–1.2)
Total Protein: 6.4 g/dL (ref 6.1–8.1)

## 2020-10-05 LAB — HEMOGLOBIN A1C
Hgb A1c MFr Bld: 5.7 % of total Hgb — ABNORMAL HIGH (ref <5.7)
Mean Plasma Glucose: 117 mg/dL
eAG (mmol/L): 6.5 mmol/L

## 2020-10-05 LAB — TSH: TSH: 1.33 mIU/L (ref 0.40–4.50)

## 2020-10-07 ENCOUNTER — Encounter: Payer: Medicare Other | Admitting: Osteopathic Medicine

## 2020-10-07 ENCOUNTER — Ambulatory Visit (INDEPENDENT_AMBULATORY_CARE_PROVIDER_SITE_OTHER): Payer: Medicare Other | Admitting: Osteopathic Medicine

## 2020-10-07 DIAGNOSIS — Z0000 Encounter for general adult medical examination without abnormal findings: Secondary | ICD-10-CM

## 2020-10-07 NOTE — Progress Notes (Signed)
MEDICARE ANNUAL WELLNESS VISIT  10/07/2020  Telephone Visit Disclaimer This Medicare AWV was conducted by telephone due to national recommendations for restrictions regarding the COVID-19 Pandemic (e.g. social distancing).  I verified, using two identifiers, that I am speaking with [name] or their authorized healthcare agent. I discussed the limitations, risks, security, and privacy concerns of performing an evaluation and management service by telephone and the potential availability of an in-person appointment in the future. The patient expressed understanding and agreed to proceed.  Location of Patient: Home Location of Provider (nurse):  In the office.  Subjective:    [name] is a 75 y.o. female patient of Emeterio Reeve, DO who had a Medicare Annual Wellness Visit today via telephone. [name] is Retired and lives with their spouse. she has 2 children. she reports that she is socially active and does interact with friends/family regularly. she is minimally physically active and enjoys house work and shopping.  Patient Care Team: Emeterio Reeve, DO as PCP - General (Osteopathic Medicine) Ladene Artist, MD as Attending Physician (Gastroenterology) Luetta Nutting, MD as Referring Physician (Orthopedic Surgery) Emily Filbert, MD (Inactive) as Attending Physician (Obstetrics and Gynecology) Rutherford Guys, MD as Consulting Physician (Ophthalmology)  Advanced Directives 10/07/2020 11/15/2018 02/09/2018 11/17/2016 09/03/2016 10/16/2015 03/08/2015  Does Patient Have a Medical Advance Directive? No;Yes Yes No Yes Yes No;Yes No  Type of Advance Directive Living will;Healthcare Power of [place];Living will - Living will [place];Living will Living will;Healthcare Power of Attorney -  Does patient want to make changes to medical advance directive? No - Patient declined No - Patient declined - - - - -  Copy of [place] in Chart? - No - copy requested - - - No - copy requested [facility] Utilization Over the Past 12 Months: # of hospitalizations or ER visits: 0 # of surgeries: 0  Review of Systems    Patient reports that her overall health is unchanged compared to last year.  History obtained from chart review and the patient  Patient Reported Readings (BP, Pulse, CBG, Weight, etc) none  Pain Assessment Pain : No/denies pain     Current Medications & Allergies (verified) Allergies as of 10/07/2020      Reactions   Erythromycin    REACTION: Rash   Meperidine Other (See Comments)   Gi upset   Meperidine Hcl    REACTION: Nausea      Medication List       Accurate as of October 07, 2020  3:26 PM. If you have any questions, ask your nurse or doctor.        amLODipine 5 MG tablet Commonly known as: NORVASC TAKE 1 TABLET BY MOUTH  DAILY   calcium carbonate 500 MG chewable tablet Commonly known as: TUMS - dosed in mg elemental calcium Chew 1 tablet by mouth 2 (two) times daily.   calcium citrate-vitamin D 315-200 MG-UNIT tablet Commonly known as: CITRACAL+D Take 1 tablet by mouth 2 (two) times daily.   carvedilol 3.125 MG tablet Commonly known as: COREG TAKE ONE-HALF TABLET BY  MOUTH TWICE DAILY WITH  MEALS   cholecalciferol 1000 units tablet Commonly known as: VITAMIN D Take 1,000 Units by mouth daily.   ferrous sulfate 325 (65 FE) MG tablet Take 325 mg by mouth daily with breakfast. Twice a day   levothyroxine 25 MCG tablet Commonly known as: SYNTHROID TAKE 1 TABLET BY MOUTH  DAILY BEFORE BREAKFAST   losartan-hydrochlorothiazide 100-25 MG tablet Commonly known as: HYZAAR TAKE 1 TABLET BY MOUTH  DAILY   lovastatin 40 MG tablet Commonly known as: MEVACOR Take 1 tablet (40 mg total) by mouth 2 (two) times daily.   Melatonin 5 MG Tbdp Take by mouth. Take one by mouth at bedtime   meloxicam 7.5 MG tablet Commonly known as: MOBIC Take 1-2 tablets (7.5-15  mg total) by mouth daily. As needed for aches/pains and arthritis   Potassium Chloride CR 8 MEQ Cpcr capsule CR Commonly known as: MICRO-K TAKE 2 CAPSULES BY MOUTH  TWICE DAILY   raloxifene 60 MG tablet Commonly known as: EVISTA TAKE 1 TABLET BY MOUTH  DAILY   Restasis 0.05 % ophthalmic emulsion Generic drug: cycloSPORINE       History (reviewed): Past Medical History:  Diagnosis Date  . Anemia   . Arthritis   . Blood transfusion    for GI bleed 1987  . Diabetes mellitus   . High cholesterol   . Hypertension   . Thyroid disease    Past Surgical History:  Procedure Laterality Date  . BREAST BIOPSY    . LUMBAR DISC SURGERY  10/26/2012  . rt knee arthroscopy  05/2008  . THYROIDECTOMY, PARTIAL  05/2002   Family History  Problem Relation Age of Onset  . Colon cancer Mother   . High blood pressure Mother   . Breast cancer Mother   . High blood pressure Father   . Heart attack Father   . Heart attack Maternal Grandfather   . Stroke Maternal Grandfather   . Stroke Paternal Grandfather    Social History   Socioeconomic History  . Marital status: Married    Spouse name: Rush Landmark  . Number of children: 2  . Years of education: 10  . Highest education level: Associate degree: academic program  Occupational History  . Occupation: Sales promotion account executive    Comment: retired  Tobacco Use  . Smoking status: Former Smoker    Quit date: 09/09/1986    Years since quitting: 34.1  . Smokeless tobacco: Never Used  Vaping Use  . Vaping Use: Never used  Substance and Sexual Activity  . Alcohol use: Not Currently  . Drug use: No  . Sexual activity: Not Currently    Partners: Male    Birth control/protection: Abstinence  Other Topics Concern  . Not on file  Social History Narrative   Lives with her husband. Currently, in the process of moving to [place] to be near her children and grandchildren. Likes to [place] and shopping.   Social Determinants of Health    Financial Resource Strain: Low Risk   . Difficulty of Paying Living Expenses: Not hard at all  Food Insecurity: No Food Insecurity  . Worried About Charity fundraiser in the Last Year: Never true  . Ran Out of Food in the Last Year: Never true  Transportation Needs: No Transportation Needs  . Lack of Transportation (Medical): No  . Lack of Transportation (Non-Medical): No  Physical Activity: Inactive  . Days of Exercise per Week: 0 days  . Minutes of Exercise per Session: 0 min  Stress: No Stress Concern Present  . Feeling of Stress : Not at all  Social Connections: Moderately Isolated  . Frequency of Communication with Friends and Family: More than three times a week  . Frequency of Social Gatherings with Friends and Family: Never  . Attends Religious Services: Never  . Active Member  of Clubs or Organizations: No  . Attends Archivist Meetings: Never  . Marital Status: Married    Activities of Daily Living In your present state of health, do you have any difficulty performing the following activities: 10/07/2020  Hearing? Y  Comment feels that it is getting a little worse.  Vision? N  Difficulty concentrating or making decisions? N  Walking or climbing stairs? Y  Comment stairs are difficult  Dressing or bathing? N  Doing errands, shopping? N  Preparing Food and eating ? N  Using the Toilet? N  In the past six months, have you accidently leaked urine? Y  Comment sometimes with sneezing and/or coughing  Do you have problems with loss of bowel control? N  Managing your Medications? N  Managing your Finances? N  Housekeeping or managing your Housekeeping? N  Some recent data might be hidden    Patient Education/ Literacy How often do you need to have someone help you when you read instructions, pamphlets, or other written materials from your doctor or pharmacy?: 1 - Never What is the last grade level you completed in school?: three years of  college.  Exercise Current Exercise Habits: The patient does not participate in regular exercise at present, Exercise limited by: None identified  Diet Patient reports consuming 2 meals a day and 2 snack(s) a day Patient reports that her primary diet is: Regular Patient reports that she does have regular access to food.   Depression Screen PHQ 2/9 Scores 10/07/2020 10/04/2019 11/15/2018 05/03/2018  PHQ - 2 Score 0 1 1 2   PHQ- 9 Score - 2 - 4     Fall Risk Fall Risk  10/07/2020 10/04/2019 11/15/2018  Falls in the past year? 0 0 0  Number falls in past yr: 0 0 -  Injury with Fall? 0 0 -  Risk for fall due to : No Fall Risks - -  Follow up Falls evaluation completed - Falls prevention discussed     Objective:  [name] seemed alert and oriented and she participated appropriately during our telephone visit.  Blood Pressure Weight BMI  BP Readings from Last 3 Encounters:  03/07/20 (!) 150/63  10/04/19 140/87  11/15/18 134/86   Wt Readings from Last 3 Encounters:  10/04/19 164 lb (74.4 kg)  11/15/18 150 lb (68 kg)  08/09/18 152 lb (68.9 kg)   BMI Readings from Last 1 Encounters:  10/04/19 29.05 kg/m    *Unable to obtain current vital signs, weight, and BMI due to telephone visit type  Hearing/Vision  . [name] did not seem to have difficulty with hearing/understanding during the telephone conversation . Reports that she has had a formal eye exam by an eye care professional within the past year . Reports that she has not had a formal hearing evaluation within the past year *Unable to fully assess hearing and vision during telephone visit type  Cognitive Function: 6CIT Screen 10/07/2020 11/15/2018  What Year? 0 points 0 points  What month? 0 points 0 points  What time? 0 points 0 points  Count back from 20 0 points 0 points  Months in reverse 0 points 0 points  Repeat phrase 0 points 0 points  Total Score 0 0   (Normal:0-7, Significant for Dysfunction: >8)  Normal  Cognitive Function Screening: Yes   Immunization & Health Maintenance Record Immunization History  Administered Date(s) Administered  . Fluad Quad(high Dose 65+) 03/07/2019, 03/07/2020  . Influenza Inj Mdck Quad Pf 04/06/2018  .  Influenza Whole 03/24/2010, 05/05/2012  . Influenza, High Dose Seasonal PF 06/13/2013, 03/07/2015, 04/12/2017  . Influenza,inj,Quad PF,6+ Mos 04/23/2014  . Influenza-Unspecified 05/11/2011  . Moderna SARS-COV2 Booster Vaccination 04/17/2020  . Moderna Sars-Covid-2 Vaccination 07/29/2019, 08/28/2019  . Pneumococcal Conjugate-13 09/01/2013  . Pneumococcal Polysaccharide-23 08/05/2009  . Td 08/05/2009  . Zoster 09/01/2012    Health Maintenance  Topic Date Due  . FOOT EXAM  10/08/2020 (Originally 11/17/2017)  . PNA vac Low Risk Adult (2 of 2 - PPSV23) 10/08/2020 (Originally 09/02/2014)  . COLONOSCOPY (Pts 45-31yrs Insurance coverage will need to be confirmed)  10/07/2021 (Originally 09/22/2020)  . TETANUS/TDAP  10/07/2021 (Originally 08/06/2019)  . INFLUENZA VACCINE  01/20/2021  . HEMOGLOBIN A1C  04/05/2021  . OPHTHALMOLOGY EXAM  05/21/2021  . DEXA SCAN  Completed  . COVID-19 Vaccine  Completed  . Hepatitis C Screening  Completed  . HPV VACCINES  Aged Out       Assessment  This is a routine wellness examination for [name].  Health Maintenance: Due or Overdue There are no preventive care reminders to display for this patient.  [name] does not need a referral for [employer] Assistance: Care Management:   no Social Work:    no Prescription Assistance:  no Nutrition/Diabetes Education:  no   Plan:  Personalized Goals Goals Addressed              This Visit's Progress   .  Patient Stated (pt-stated)        Patient stated that she would like to loose 10 lbs.      Personalized Health Maintenance & Screening Recommendations  Pneumococcal vaccine  Td vaccine Colorectal cancer screening  Shingrix - wants to discuss with Dr.  Sheppard Coil  Lung Cancer Screening Recommended: no (Low Dose CT Chest recommended if Age 58-80 years, 30 pack-year currently smoking OR have quit w/in past 15 years) Hepatitis C Screening recommended: no HIV Screening recommended: no  Advanced Directives: Written information was not prepared per patient's request.  Referrals & Orders No orders of the defined types were placed in this encounter.   Follow-up Plan . Follow-up with Emeterio Reeve, DO as planned . Patient wants to discuss the Pneumonia and the Shingles vaccine with Dr. Sheppard Coil.  . Please make your appointment at the pharmacy for your tetanus shot. . Medicare Wellness in one year. . Let us know when you have the colonoscopy, so we can update your chart.   I have personally reviewed and noted the following in the patient's chart:   . Medical and social history . Use of alcohol, tobacco or illicit drugs  . Current medications and supplements . Functional ability and status . Nutritional status . Physical activity . Advanced directives . List of other physicians . Hospitalizations, surgeries, and ER visits in previous 12 months . Vitals . Screenings to include cognitive, depression, and falls . Referrals and appointments  In addition, I have reviewed and discussed with [name] certain preventive protocols, quality metrics, and best practice recommendations. A written personalized care plan for preventive services as well as general preventive health recommendations is available and can be mailed to the patient at her request.      Tinnie Gens, RN  10/07/2020

## 2020-10-07 NOTE — Patient Instructions (Addendum)
[place] Maintenance Summary and Written Plan of Care  [name] ,  Thank you for allowing me to perform your Medicare Annual Wellness Visit and for your ongoing commitment to your health.   Health Maintenance & Immunization History Health Maintenance  Topic Date Due  . FOOT EXAM  10/08/2020 (Originally 11/17/2017)  . PNA vac Low Risk Adult (2 of 2 - PPSV23) 10/08/2020 (Originally 09/02/2014)  . COLONOSCOPY (Pts 45-102yrs Insurance coverage will need to be confirmed)  10/07/2021 (Originally 09/22/2020)  . TETANUS/TDAP  10/07/2021 (Originally 08/06/2019)  . INFLUENZA VACCINE  01/20/2021  . HEMOGLOBIN A1C  04/05/2021  . OPHTHALMOLOGY EXAM  05/21/2021  . DEXA SCAN  Completed  . COVID-19 Vaccine  Completed  . Hepatitis C Screening  Completed  . HPV VACCINES  Aged Out   Immunization History  Administered Date(s) Administered  . Fluad Quad(high Dose 65+) 03/07/2019, 03/07/2020  . Influenza Inj Mdck Quad Pf 04/06/2018  . Influenza Whole 03/24/2010, 05/05/2012  . Influenza, High Dose Seasonal PF 06/13/2013, 03/07/2015, 04/12/2017  . Influenza,inj,Quad PF,6+ Mos 04/23/2014  . Influenza-Unspecified 05/11/2011  . Moderna SARS-COV2 Booster Vaccination 04/17/2020  . Moderna Sars-Covid-2 Vaccination 07/29/2019, 08/28/2019  . Pneumococcal Conjugate-13 09/01/2013  . Pneumococcal Polysaccharide-23 08/05/2009  . Td 08/05/2009  . Zoster 09/01/2012    These are the patient goals that we discussed: Goals Addressed              This Visit's Progress   .  Patient Stated (pt-stated)        Patient stated that she would like to loose 10 lbs.        This is a list of Health Maintenance Items that are overdue or due now: Pneumococcal vaccine  Td vaccine Colorectal cancer screening  Shingrix - wants to discuss with Dr. Sheppard Coil  Orders/Referrals Placed Today: No orders of the defined types were placed in this encounter.  (Contact our referral department at  [phone] if you have not spoken with someone about your referral appointment within the next 5 days)    Follow-up Plan . Follow-up with Emeterio Reeve, DO as planned . Patient wants to discuss the Pneumonia and the Shingles vaccine with Dr. Sheppard Coil.  . Please make your appointment at the pharmacy for your tetanus shot. . Medicare Wellness in one year. . Let us know when you have the colonoscopy, so we can update your chart.       Colonoscopy, Adult A colonoscopy is a procedure to look at the entire large intestine. This procedure is done using a long, thin, flexible tube that has a camera on the end. You may have a colonoscopy:  As a part of normal colorectal screening.  If you have certain symptoms, such as: ? A low number of red blood cells in your blood (anemia). ? Diarrhea that does not go away. ? Pain in your abdomen. ? Blood in your stool. A colonoscopy can help screen for and diagnose medical problems, including:  Tumors.  Extra tissue that grows where mucus forms (polyps).  Inflammation.  Areas of bleeding. Tell your health care provider about:  Any allergies you have.  All medicines you are taking, including vitamins, herbs, eye drops, creams, and over-the-counter medicines.  Any problems you or family members have had with anesthetic medicines.  Any blood disorders you have.  Any surgeries you have had.  Any medical conditions you have.  Any problems you have had with having bowel movements.  Whether you are pregnant or  may be pregnant. What are the risks? Generally, this is a safe procedure. However, problems may occur, including:  Bleeding.  Damage to your intestine.  Allergic reactions to medicines given during the procedure.  Infection. This is rare. What happens before the procedure? Eating and drinking restrictions Follow instructions from your health care provider about eating or drinking restrictions, which may include:  A  few days before the procedure: ? Follow a low-fiber diet. ? Avoid nuts, seeds, dried fruit, raw fruits, and vegetables.  1-3 days before the procedure: ? Eat only gelatin dessert or ice pops. ? Drink only clear liquids, such as water, clear juice, clear broth or bouillon, black coffee or tea, or clear soft drinks or sports drinks. ? Avoid liquids that contain red or purple dye.  The day of the procedure: ? Do not eat solid foods. You may continue to drink clear liquids until up to 2 hours before the procedure. ? Do not eat or drink anything starting 2 hours before the procedure, or within the time period that your health care provider recommends. Bowel prep If you were prescribed a bowel prep to take by mouth (orally) to clean out your colon:  Take it as told by your health care provider. Starting the day before your procedure, you will need to drink a large amount of liquid medicine. The liquid will cause you to have many bowel movements of loose stool until your stool becomes almost clear or light green.  If your skin or the opening between the buttocks (anus) gets irritated from diarrhea, you may relieve the irritation using: ? Wipes with medicine in them, such as adult wet wipes with aloe and vitamin E. ? A product to soothe skin, such as petroleum jelly.  If you vomit while drinking the bowel prep: ? Take a break for up to 60 minutes. ? Begin the bowel prep again. ? Call your health care provider if you keep vomiting or you cannot take the bowel prep without vomiting.  To clean out your colon, you may also be given: ? Laxative medicines. These help you have a bowel movement. ? Instructions for enema use. An enema is liquid medicine injected into your rectum. Medicines Ask your health care provider about:  Changing or stopping your regular medicines or supplements. This is especially important if you are taking iron supplements, diabetes medicines, or blood thinners.  Taking  medicines such as aspirin and ibuprofen. These medicines can thin your blood. Do not take these medicines unless your health care provider tells you to take them.  Taking over-the-counter medicines, vitamins, herbs, and supplements. General instructions  Ask your health care provider what steps will be taken to help prevent infection. These may include washing skin with a germ-killing soap.  Plan to have someone take you home from the hospital or clinic. What happens during the procedure?  An IV will be inserted into one of your veins.  You may be given one or more of the following: ? A medicine to help you relax (sedative). ? A medicine to numb the area (local anesthetic). ? A medicine to make you fall asleep (general anesthetic). This is rarely needed.  You will lie on your side with your knees bent.  The tube will: ? Have oil or gel put on it (be lubricated). ? Be inserted into your anus. ? Be gently eased through all parts of your large intestine.  Air will be sent into your colon to keep it open. This may  cause some pressure or cramping.  Images will be taken with the camera and will appear on a screen.  A small tissue sample may be removed to be looked at under a microscope (biopsy). The tissue may be sent to a lab for testing if any signs of problems are found.  If small polyps are found, they may be removed and checked for cancer cells.  When the procedure is finished, the tube will be removed. The procedure may vary among health care providers and hospitals.   What happens after the procedure?  Your blood pressure, heart rate, breathing rate, and blood oxygen level will be monitored until you leave the hospital or clinic.  You may have a small amount of blood in your stool.  You may pass gas and have mild cramping or bloating in your abdomen. This is caused by the air that was used to open your colon during the exam.  Do not drive for 24 hours after the  procedure.  It is up to you to get the results of your procedure. Ask your health care provider, or the department that is doing the procedure, when your results will be ready. Summary  A colonoscopy is a procedure to look at the entire large intestine.  Follow instructions from your health care provider about eating and drinking before the procedure.  If you were prescribed an oral bowel prep to clean out your colon, take it as told by your health care provider.  During the colonoscopy, a flexible tube with a camera on its end is inserted into the anus and then passed into the other parts of the large intestine. This information is not intended to replace advice given to you by your health care provider. Make sure you discuss any questions you have with your health care provider. Document Revised: 12/30/2018 Document Reviewed: 12/30/2018 Elsevier Patient Education  [place] Maintenance, Female Adopting a healthy lifestyle and getting preventive care are important in promoting health and wellness. Ask your health care provider about:  The right schedule for you to have regular tests and exams.  Things you can do on your own to prevent diseases and keep yourself healthy. What should I know about diet, weight, and exercise? Eat a healthy diet  Eat a diet that includes plenty of vegetables, fruits, low-fat dairy products, and lean protein.  Do not eat a lot of foods that are high in solid fats, added sugars, or sodium.   Maintain a healthy weight Body mass index (BMI) is used to identify weight problems. It estimates body fat based on height and weight. Your health care provider can help determine your BMI and help you achieve or maintain a healthy weight. Get regular exercise Get regular exercise. This is one of the most important things you can do for your health. Most adults should:  Exercise for at least 150 minutes each week. The exercise should increase your  heart rate and make you sweat (moderate-intensity exercise).  Do strengthening exercises at least twice a week. This is in addition to the moderate-intensity exercise.  Spend less time sitting. Even light physical activity can be beneficial. Watch cholesterol and blood lipids Have your blood tested for lipids and cholesterol at 76 years of age, then have this test every 5 years. Have your cholesterol levels checked more often if:  Your lipid or cholesterol levels are high.  You are older than 76 years of age.  You are at high risk for  heart disease. What should I know about cancer screening? Depending on your health history and family history, you may need to have cancer screening at various ages. This may include screening for:  Breast cancer.  Cervical cancer.  Colorectal cancer.  Skin cancer.  Lung cancer. What should I know about heart disease, diabetes, and high blood pressure? Blood pressure and heart disease  High blood pressure causes heart disease and increases the risk of stroke. This is more likely to develop in people who have high blood pressure readings, are of African descent, or are overweight.  Have your blood pressure checked: ? Every 3-5 years if you are 19-2 years of age. ? Every year if you are 13 years old or older. Diabetes Have regular diabetes screenings. This checks your fasting blood sugar level. Have the screening done:  Once every three years after age 19 if you are at a normal weight and have a low risk for diabetes.  More often and at a younger age if you are overweight or have a high risk for diabetes. What should I know about preventing infection? Hepatitis B If you have a higher risk for hepatitis B, you should be screened for this virus. Talk with your health care provider to find out if you are at risk for hepatitis B infection. Hepatitis C Testing is recommended for:  Everyone born from [birth year] through 1966.  Anyone with known risk  factors for hepatitis C. Sexually transmitted infections (STIs)  Get screened for STIs, including gonorrhea and chlamydia, if: ? You are sexually active and are younger than 76 years of age. ? You are older than 76 years of age and your health care provider tells you that you are at risk for this type of infection. ? Your sexual activity has changed since you were last screened, and you are at increased risk for chlamydia or gonorrhea. Ask your health care provider if you are at risk.  Ask your health care provider about whether you are at high risk for HIV. Your health care provider may recommend a prescription medicine to help prevent HIV infection. If you choose to take medicine to prevent HIV, you should first get tested for HIV. You should then be tested every 3 months for as long as you are taking the medicine. Pregnancy  If you are about to stop having your period (premenopausal) and you may become pregnant, seek counseling before you get pregnant.  Take 400 to 800 micrograms (mcg) of folic acid every day if you become pregnant.  Ask for birth control (contraception) if you want to prevent pregnancy. Osteoporosis and menopause Osteoporosis is a disease in which the bones lose minerals and strength with aging. This can result in bone fractures. If you are 79 years old or older, or if you are at risk for osteoporosis and fractures, ask your health care provider if you should:  Be screened for bone loss.  Take a calcium or vitamin D supplement to lower your risk of fractures.  Be given hormone replacement therapy (HRT) to treat symptoms of menopause. Follow these instructions at home: Lifestyle  Do not use any products that contain nicotine or tobacco, such as cigarettes, e-cigarettes, and chewing tobacco. If you need help quitting, ask your health care provider.  Do not use street drugs.  Do not share needles.  Ask your health care provider for help if you need support or  information about quitting drugs. Alcohol use  Do not drink alcohol if: ? Your  health care provider tells you not to drink. ? You are pregnant, may be pregnant, or are planning to become pregnant.  If you drink alcohol: ? Limit how much you use to 0-1 drink a day. ? Limit intake if you are breastfeeding.  Be aware of how much alcohol is in your drink. In the [place] one drink equals one 12 oz bottle of beer (355 mL), one 5 oz glass of wine (148 mL), or one 1 oz glass of hard liquor (44 mL). General instructions  Schedule regular health, dental, and eye exams.  Stay current with your vaccines.  Tell your health care provider if: ? You often feel depressed. ? You have ever been abused or do not feel safe at home. Summary  Adopting a healthy lifestyle and getting preventive care are important in promoting health and wellness.  Follow your health care provider's instructions about healthy diet, exercising, and getting tested or screened for diseases.  Follow your health care provider's instructions on monitoring your cholesterol and blood pressure. This information is not intended to replace advice given to you by your health care provider. Make sure you discuss any questions you have with your health care provider. Document Revised: 06/01/2018 Document Reviewed: 06/01/2018 Elsevier Patient Education  2021 [employer].

## 2020-10-08 ENCOUNTER — Ambulatory Visit (INDEPENDENT_AMBULATORY_CARE_PROVIDER_SITE_OTHER): Payer: Medicare Other | Admitting: Osteopathic Medicine

## 2020-10-08 ENCOUNTER — Encounter: Payer: Self-pay | Admitting: Osteopathic Medicine

## 2020-10-08 VITALS — BP 138/76 | HR 72 | Temp 98.6°F | Wt 165.0 lb

## 2020-10-08 DIAGNOSIS — Z8249 Family history of ischemic heart disease and other diseases of the circulatory system: Secondary | ICD-10-CM | POA: Diagnosis not present

## 2020-10-08 DIAGNOSIS — Z0000 Encounter for general adult medical examination without abnormal findings: Secondary | ICD-10-CM | POA: Diagnosis not present

## 2020-10-08 MED ORDER — SHINGRIX 50 MCG/0.5ML IM SUSR: 0.5000 mL | Freq: Once | INTRAMUSCULAR | 1 refills | Status: AC

## 2020-10-08 NOTE — Progress Notes (Signed)
[name] is a 75 y.o. female who presents to  [place] at [facility]  today, 10/08/20, seeking care for the following:  . Annual physical  . Request for genetic testing, has had a cousin with diagnosis of hypertrophic cardiomyopathy and it was recommended to her that she pursue genetic testing for this.  Patient presents instructions from genetic testing company, see scanned documents. . Patient will be moving to [place] in the next month or 2, refills up-to-date for medications.     ASSESSMENT & PLAN with other pertinent findings:  The primary encounter diagnosis was Annual physical exam. A diagnosis of Family history of hypertrophic cardiomyopathy was also pertinent to this visit.    Patient Instructions   Health Maintenance  Topic Date Due  . FOOT EXAM  10/08/2020 (Originally 11/17/2017)  . PNA vac Low Risk Adult (2 of 2 - PPSV23) 10/08/2020 (Originally 09/02/2014)  . COLONOSCOPY (Pts 45-31yrs Insurance coverage will need to be confirmed)  10/07/2021 (Originally 09/22/2020)  . TETANUS/TDAP  10/07/2021 (Originally 08/06/2019)  . INFLUENZA VACCINE  01/20/2021  . HEMOGLOBIN A1C  04/05/2021  . OPHTHALMOLOGY EXAM  05/21/2021  . DEXA SCAN  Completed  . COVID-19 Vaccine  Completed  . Hepatitis C Screening  Completed  . HPV VACCINES  Aged Out   Immunization History  Administered Date(s) Administered  . Fluad Quad(high Dose 65+) 03/07/2019, 03/07/2020  . Influenza Inj Mdck Quad Pf 04/06/2018  . Influenza Whole 03/24/2010, 05/05/2012  . Influenza, High Dose Seasonal PF 06/13/2013, 03/07/2015, 04/12/2017  . Influenza,inj,Quad PF,6+ Mos 04/23/2014  . Influenza-Unspecified 05/11/2011  . Moderna SARS-COV2 Booster Vaccination 04/17/2020  . Moderna Sars-Covid-2 Vaccination 07/29/2019, 08/28/2019  . Pneumococcal Conjugate-13 09/01/2013  . Pneumococcal Polysaccharide-23 08/05/2009  . Td 08/05/2009  . Zoster 09/01/2012        Orders Placed This Encounter  Procedures  . Ambulatory referral to Genetics    Meds ordered this encounter  Medications  . Zoster Vaccine Adjuvanted [facility]) injection    Sig: Inject 0.5 mLs into the muscle once for 1 dose. Repeat in 2-6 mos    Dispense:  0.5 mL    Refill:  1  . amLODipine (NORVASC) 5 MG tablet    Sig: Take 1 tablet (5 mg total) by mouth daily.    Dispense:  90 tablet    Refill:  3    Requesting 1 year supply  . carvedilol (COREG) 3.125 MG tablet    Sig: TAKE ONE-HALF TABLET BY  MOUTH TWICE DAILY WITH  MEALS    Dispense:  90 tablet    Refill:  3    No refills. Needs an appt.  . levothyroxine (SYNTHROID) 25 MCG tablet    Sig: Take 1 tablet (25 mcg total) by mouth daily before breakfast.    Dispense:  90 tablet    Refill:  3    Requesting 1 year supply  . losartan-hydrochlorothiazide (HYZAAR) 100-25 MG tablet    Sig: Take 1 tablet by mouth daily.    Dispense:  90 tablet    Refill:  3    Requesting 1 year supply  . lovastatin (MEVACOR) 40 MG tablet    Sig: Take 1 tablet (40 mg total) by mouth 2 (two) times daily.    Dispense:  180 tablet    Refill:  3  . meloxicam (MOBIC) 7.5 MG tablet    Sig: Take 1-2 tablets (7.5-15 mg total) by mouth daily. As needed for aches/pains and arthritis  Dispense:  180 tablet    Refill:  3  . Potassium Chloride CR (MICRO-K) 8 MEQ CPCR capsule CR    Sig: Take 2 capsules (16 mEq total) by mouth 2 (two) times daily.    Dispense:  360 capsule    Refill:  3    Requesting 1 year supply  . raloxifene (EVISTA) 60 MG tablet    Sig: Take 1 tablet (60 mg total) by mouth daily.    Dispense:  90 tablet    Refill:  3    Requesting 1 year supply     See below for relevant physical exam findings  See below for recent lab and imaging results reviewed  Medications, allergies, PMH, PSH, SocH, FamH reviewed below    Follow-up instructions: No follow-ups on  file.                                        Exam:  BP 138/76 (BP Location: Left Arm, Patient Position: Sitting, Cuff Size: Normal)   Pulse 72   Temp 98.6 F (37 C) (Oral)   Wt 165 lb (74.8 kg)   BMI 29.23 kg/m   Constitutional: VS see above. General Appearance: alert, well-developed, well-nourished, NAD  Neck: No masses, trachea midline.   Respiratory: Normal respiratory effort. no wheeze, no rhonchi, no rales  Cardiovascular: S1/S2 normal, no murmur, no rub/gallop auscultated. RRR.   Musculoskeletal: Gait normal. Symmetric and independent movement of all extremities  Abdominal: non-tender, non-distended, no appreciable organomegaly, neg Murphy's, BS WNLx4  Neurological: Normal balance/coordination. No tremor.  Skin: warm, dry, intact.   Psychiatric: Normal judgment/insight. Normal mood and affect. Oriented x3.   Current Meds  Medication Sig  . calcium carbonate (TUMS - DOSED IN MG ELEMENTAL CALCIUM) 500 MG chewable tablet Chew 1 tablet by mouth 2 (two) times daily.  . calcium citrate-vitamin D (CITRACAL+D) 315-200 MG-UNIT tablet Take 1 tablet by mouth 2 (two) times daily.  . ferrous sulfate 325 (65 FE) MG tablet Take 325 mg by mouth daily with breakfast. Twice a day  . Melatonin 5 MG TBDP Take by mouth. Take one by mouth at bedtime  . RESTASIS 0.05 % ophthalmic emulsion   . [EXPIRED] Zoster Vaccine Adjuvanted [facility]) injection Inject 0.5 mLs into the muscle once for 1 dose. Repeat in 2-6 mos  . [DISCONTINUED] amLODipine (NORVASC) 5 MG tablet TAKE 1 TABLET BY MOUTH  DAILY  . [DISCONTINUED] carvedilol (COREG) 3.125 MG tablet TAKE ONE-HALF TABLET BY  MOUTH TWICE DAILY WITH  MEALS  . [DISCONTINUED] levothyroxine (SYNTHROID) 25 MCG tablet TAKE 1 TABLET BY MOUTH  DAILY BEFORE BREAKFAST  . [DISCONTINUED] losartan-hydrochlorothiazide (HYZAAR) 100-25 MG tablet TAKE 1 TABLET BY MOUTH  DAILY  . [DISCONTINUED] lovastatin (MEVACOR) 40 MG tablet  Take 1 tablet (40 mg total) by mouth 2 (two) times daily.  . [DISCONTINUED] meloxicam (MOBIC) 7.5 MG tablet Take 1-2 tablets (7.5-15 mg total) by mouth daily. As needed for aches/pains and arthritis  . [DISCONTINUED] Potassium Chloride CR (MICRO-K) 8 MEQ CPCR capsule CR TAKE 2 CAPSULES BY MOUTH  TWICE DAILY  . [DISCONTINUED] raloxifene (EVISTA) 60 MG tablet TAKE 1 TABLET BY MOUTH  DAILY    Allergies  Allergen Reactions  . Erythromycin     REACTION: Rash  . Meperidine Other (See Comments)    Gi upset  . Meperidine Hcl     REACTION: Nausea    Patient Active  Problem List   Diagnosis Date Noted  . S/P right unicompartmental knee replacement 09/21/2016  . Hyperglycemia 04/24/2014  . Hypokalemia 09/02/2013  . Routine general medical examination at a health care facility 09/01/2013  . Spinal stenosis, lumbar 01/12/2012  . Low back pain radiating to right leg 12/28/2011  . Encounter for long-term (current) use of other medications 08/13/2011  . 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 Numbness 08/13/2011  . HYPERCHOLESTEROLEMIA 08/11/2010  . Asymptomatic postmenopausal status 08/03/2008  . Anemia 08/03/2007  . NONSPECIFIC ABNORMAL ELECTROCARDIOGRAM 08/03/2007  . Essential hypertension 02/19/2007  . ALLERGIC RHINITIS 02/19/2007  . Arthritis 02/19/2007    Family History  Problem Relation Age of Onset  . Colon cancer Mother   . High blood pressure Mother   . Breast cancer Mother   . High blood pressure Father   . Heart attack Father   . Heart attack Maternal Grandfather   . Stroke Maternal Grandfather   . Stroke Paternal Grandfather     Social History   Tobacco Use  Smoking Status Former Smoker  . Quit date: 09/09/1986  . Years since quitting: 34.1  Smokeless Tobacco Never Used    Past Surgical History:  Procedure Laterality Date  . BREAST BIOPSY    . LUMBAR DISC SURGERY  10/26/2012  . rt knee arthroscopy  05/2008  . THYROIDECTOMY, PARTIAL  05/2002     Immunization History  Administered Date(s) Administered  . Fluad Quad(high Dose 65+) 03/07/2019, 03/07/2020  . Influenza Inj Mdck Quad Pf 04/06/2018  . Influenza Whole 03/24/2010, 05/05/2012  . Influenza, High Dose Seasonal PF 06/13/2013, 03/07/2015, 04/12/2017  . Influenza,inj,Quad PF,6+ Mos 04/23/2014  . Influenza-Unspecified 05/11/2011  . Moderna SARS-COV2 Booster Vaccination 04/17/2020  . Moderna Sars-Covid-2 Vaccination 07/29/2019, 08/28/2019  . Pneumococcal Conjugate-13 09/01/2013  . Pneumococcal Polysaccharide-23 08/05/2009  . Td 08/05/2009  . Zoster 09/01/2012    Recent Results (from the past 2160 hour(s))  HgB A1c     Status: Abnormal   Collection Time: 10/04/20 12:33 PM  Result Value Ref Range   Hgb A1c MFr Bld 5.7 (H) <5.7 % of total Hgb    Comment: For someone without known diabetes, a hemoglobin  A1c value between 5.7% and 6.4% is consistent with prediabetes and should be confirmed with a  follow-up test. . For someone with known diabetes, a value <7% indicates that their diabetes is well controlled. A1c targets should be individualized based on duration of diabetes, age, comorbid conditions, and other considerations. . This assay result is consistent with an increased risk of diabetes. . Currently, no consensus exists regarding use of hemoglobin A1c for diagnosis of diabetes for children. .    Mean Plasma Glucose 117 mg/dL   eAG (mmol/L) 6.5 mmol/L  COMPLETE METABOLIC PANEL WITH GFR     Status: Abnormal   Collection Time: 10/04/20 12:33 PM  Result Value Ref Range   Glucose, Bld 110 (H) 65 - 99 mg/dL    Comment: .            Fasting reference interval . For someone without known diabetes, a glucose value between 100 and 125 mg/dL is consistent with prediabetes and should be confirmed with a follow-up test. .    BUN 12 7 - 25 mg/dL   Creat 0.77 0.60 - 0.93 mg/dL    Comment: For patients >72 years of age, the reference limit for Creatinine is  approximately 13% higher for people identified as African-American. .    GFR, Est Non African American 76 >  OR = 60 mL/min/1.57m2   GFR, Est African American 88 > OR = 60 mL/min/1.58m2   BUN/Creatinine Ratio NOT APPLICABLE 6 - 22 (calc)   Sodium 141 135 - 146 mmol/L   Potassium 3.8 3.5 - 5.3 mmol/L   Chloride 103 98 - 110 mmol/L   CO2 30 20 - 32 mmol/L   Calcium 9.7 8.6 - 10.4 mg/dL   Total Protein 6.4 6.1 - 8.1 g/dL   Albumin 4.2 3.6 - 5.1 g/dL   Globulin 2.2 1.9 - 3.7 g/dL (calc)   AG Ratio 1.9 1.0 - 2.5 (calc)   Total Bilirubin 0.4 0.2 - 1.2 mg/dL   Alkaline phosphatase (APISO) 45 37 - 153 U/L   AST 17 10 - 35 U/L   ALT 13 6 - 29 U/L  Lipid Panel w/reflex Direct LDL     Status: None   Collection Time: 10/04/20 12:33 PM  Result Value Ref Range   Cholesterol 199 <200 mg/dL   HDL 98 > OR = 50 mg/dL   Triglycerides 87 <150 mg/dL   LDL Cholesterol (Calc) 83 mg/dL (calc)    Comment: Reference range: <100 . Desirable range <100 mg/dL for primary prevention;   <70 mg/dL for patients with CHD or diabetic patients  with > or = 2 CHD risk factors. Marland Kitchen LDL-C is now calculated using the Martin-Hopkins  calculation, which is a validated novel method providing  better accuracy than the Friedewald equation in the  estimation of LDL-C.  Cresenciano Genre et al. Annamaria Helling. 6195;093(26): 2061-2068  (http://education.[facility].com/faq/FAQ164)    Total CHOL/HDL Ratio 2.0 <5.0 (calc)   Non-HDL Cholesterol (Calc) 101 <130 mg/dL (calc)    Comment: For patients with diabetes plus 1 major ASCVD risk  factor, treating to a non-HDL-C goal of <100 mg/dL  (LDL-C of <70 mg/dL) is considered a therapeutic  option.   CBC     Status: None   Collection Time: 10/04/20 12:33 PM  Result Value Ref Range   WBC 6.2 3.8 - 10.8 Thousand/uL   RBC 4.16 3.80 - 5.10 Million/uL   Hemoglobin 12.9 11.7 - 15.5 g/dL   HCT 38.5 35.0 - 45.0 %   MCV 92.5 80.0 - 100.0 fL   MCH 31.0 27.0 - 33.0 pg   MCHC 33.5 32.0 - 36.0  g/dL   RDW 13.5 11.0 - 15.0 %   Platelets 303 140 - 400 Thousand/uL   MPV 9.8 7.5 - 12.5 fL  TSH     Status: None   Collection Time: 10/04/20 12:33 PM  Result Value Ref Range   TSH 1.33 0.40 - 4.50 mIU/L    No results found.     All questions at time of visit were answered - patient instructed to contact office with any additional concerns or updates. ER/RTC precautions were reviewed with the patient as applicable.   Please note: manual typing as well as voice recognition software may have been used to produce this document - typos may escape review. Please contact Dr. Sheppard Coil for any needed clarifications.

## 2020-10-08 NOTE — Patient Instructions (Signed)
Health Maintenance  Topic Date Due  . FOOT EXAM  10/08/2020 (Originally 11/17/2017)  . PNA vac Low Risk Adult (2 of 2 - PPSV23) 10/08/2020 (Originally 09/02/2014)  . COLONOSCOPY (Pts 45-44yrs Insurance coverage will need to be confirmed)  10/07/2021 (Originally 09/22/2020)  . TETANUS/TDAP  10/07/2021 (Originally 08/06/2019)  . INFLUENZA VACCINE  01/20/2021  . HEMOGLOBIN A1C  04/05/2021  . OPHTHALMOLOGY EXAM  05/21/2021  . DEXA SCAN  Completed  . COVID-19 Vaccine  Completed  . Hepatitis C Screening  Completed  . HPV VACCINES  Aged Out   Immunization History  Administered Date(s) Administered  . Fluad Quad(high Dose 65+) 03/07/2019, 03/07/2020  . Influenza Inj Mdck Quad Pf 04/06/2018  . Influenza Whole 03/24/2010, 05/05/2012  . Influenza, High Dose Seasonal PF 06/13/2013, 03/07/2015, 04/12/2017  . Influenza,inj,Quad PF,6+ Mos 04/23/2014  . Influenza-Unspecified 05/11/2011  . Moderna SARS-COV2 Booster Vaccination 04/17/2020  . Moderna Sars-Covid-2 Vaccination 07/29/2019, 08/28/2019  . Pneumococcal Conjugate-13 09/01/2013  . Pneumococcal Polysaccharide-23 08/05/2009  . Td 08/05/2009  . Zoster 09/01/2012

## 2020-10-10 MED ORDER — RALOXIFENE HCL 60 MG PO TABS: 60.0000 mg | ORAL_TABLET | Freq: Every day | ORAL | 3 refills | Status: AC

## 2020-10-10 MED ORDER — LEVOTHYROXINE SODIUM 25 MCG PO TABS: 25.0000 ug | ORAL_TABLET | Freq: Every day | ORAL | 3 refills | Status: AC

## 2020-10-10 MED ORDER — MELOXICAM 7.5 MG PO TABS: 7.5000 mg | ORAL_TABLET | Freq: Every day | ORAL | 3 refills | Status: AC

## 2020-10-10 MED ORDER — LOVASTATIN 40 MG PO TABS: 40.0000 mg | ORAL_TABLET | Freq: Two times a day (BID) | ORAL | 3 refills | Status: AC

## 2020-10-10 MED ORDER — LOSARTAN POTASSIUM-HCTZ 100-25 MG PO TABS: 1.0000 | ORAL_TABLET | Freq: Every day | ORAL | 3 refills | Status: AC

## 2020-10-10 MED ORDER — POTASSIUM CHLORIDE ER 8 MEQ PO CPCR: 2.0000 | ORAL_CAPSULE | Freq: Two times a day (BID) | ORAL | 3 refills | Status: AC

## 2020-10-10 MED ORDER — CARVEDILOL 3.125 MG PO TABS: ORAL_TABLET | ORAL | 3 refills | Status: AC

## 2020-10-10 MED ORDER — AMLODIPINE BESYLATE 5 MG PO TABS: 1.0000 | ORAL_TABLET | Freq: Every day | ORAL | 3 refills | Status: AC

## 2020-10-17 ENCOUNTER — Encounter: Payer: Medicare Other | Admitting: Osteopathic Medicine

## 2021-01-01 ENCOUNTER — Telehealth: Payer: Self-pay

## 2021-01-01 NOTE — Chronic Care Management (AMB) (Signed)
  Chronic Care Management   Note  01/01/2021 Name: [name] MRN: [id number] DOB: 17-Aug-1945  [name] is a 75 y.o. year old female who is a primary care patient of Emeterio Reeve, DO. I reached out to [employer] by phone today in response to a referral sent by [name]'s PCP, Emeterio Reeve, DO      [name] was given information about Chronic Care Management services today including:  CCM service includes personalized support from designated clinical staff supervised by her physician, including individualized plan of care and coordination with other care providers 24/7 contact phone numbers for assistance for urgent and routine care needs. Service will only be billed when office clinical staff spend 20 minutes or more in a month to coordinate care. Only one practitioner may furnish and bill the service in a calendar month. The patient may stop CCM services at any time (effective at the end of the month) by phone call to the office staff. The patient will be responsible for cost sharing (co-pay) of up to 20% of the service fee (after annual deductible is met).  Patient did not agree to enrollment in care management services and does not wish to consider at this time.  Follow up plan: Patient declines engagement by the care management team. Appropriate care team members and provider have been notified via electronic communication.   Patient has moved   Noreene Larsson, [address], [place], [place] [address] Direct Dial: [phone] [email or web address] Website: [email or web address]

## 2021-02-13 IMAGING — MG DIGITAL SCREENING BILAT W/ TOMO W/ CAD
6 of 10 series · 6 of 30 positions shown · non-contrast
Comparison: Previous exam(s).

CLINICAL DATA: Screening.

EXAM:
DIGITAL SCREENING BILATERAL MAMMOGRAM WITH TOMO AND CAD

[L CC synth-2D]
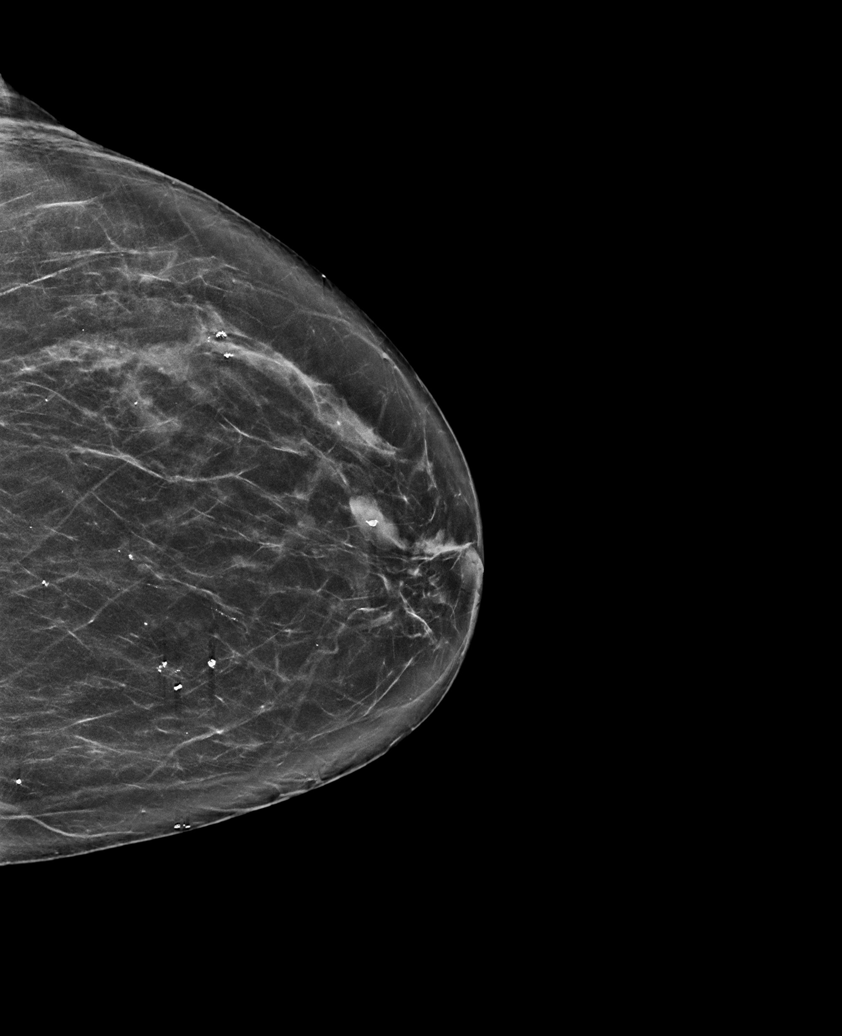

[R MLO synth-2D]
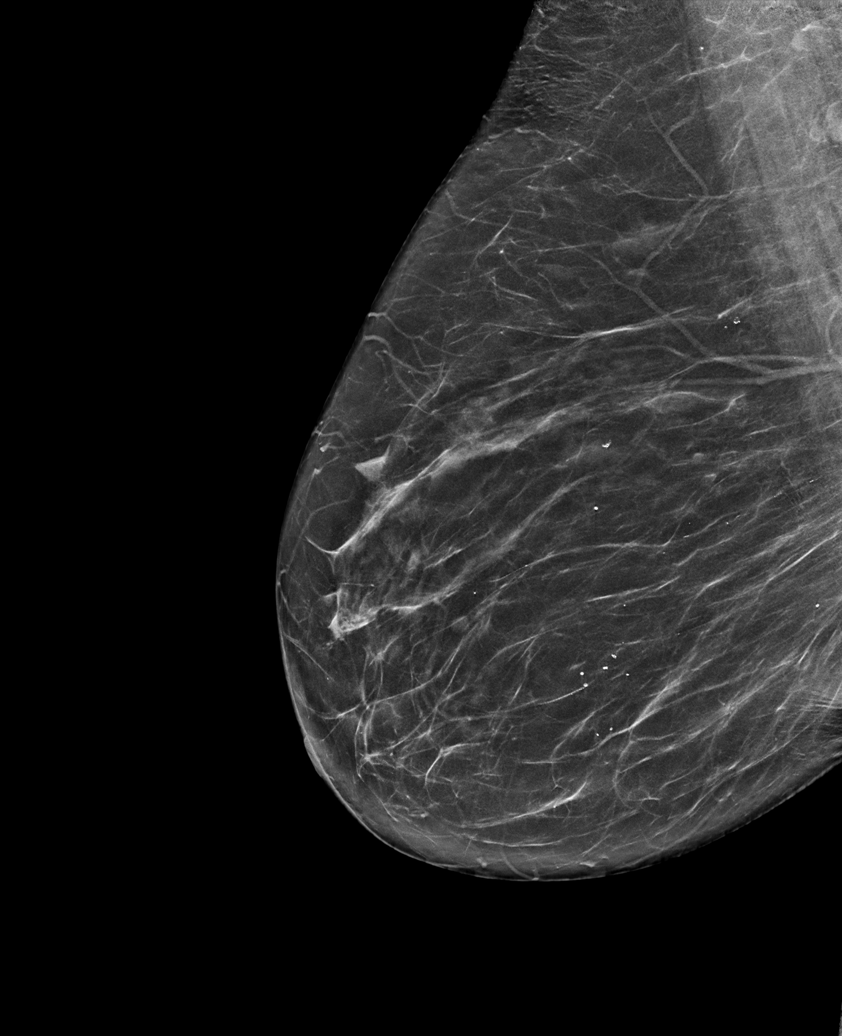

[L MLO synth-2D (1 of 2)]
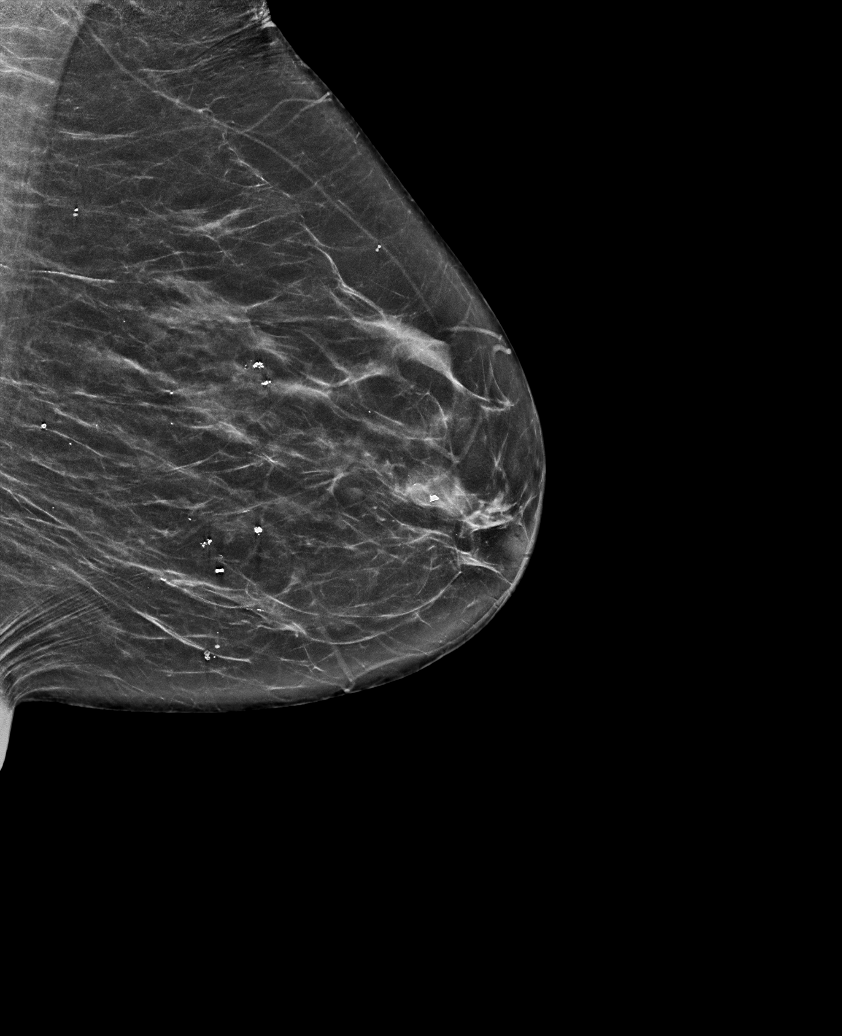

[L MLO synth-2D (2 of 2)]
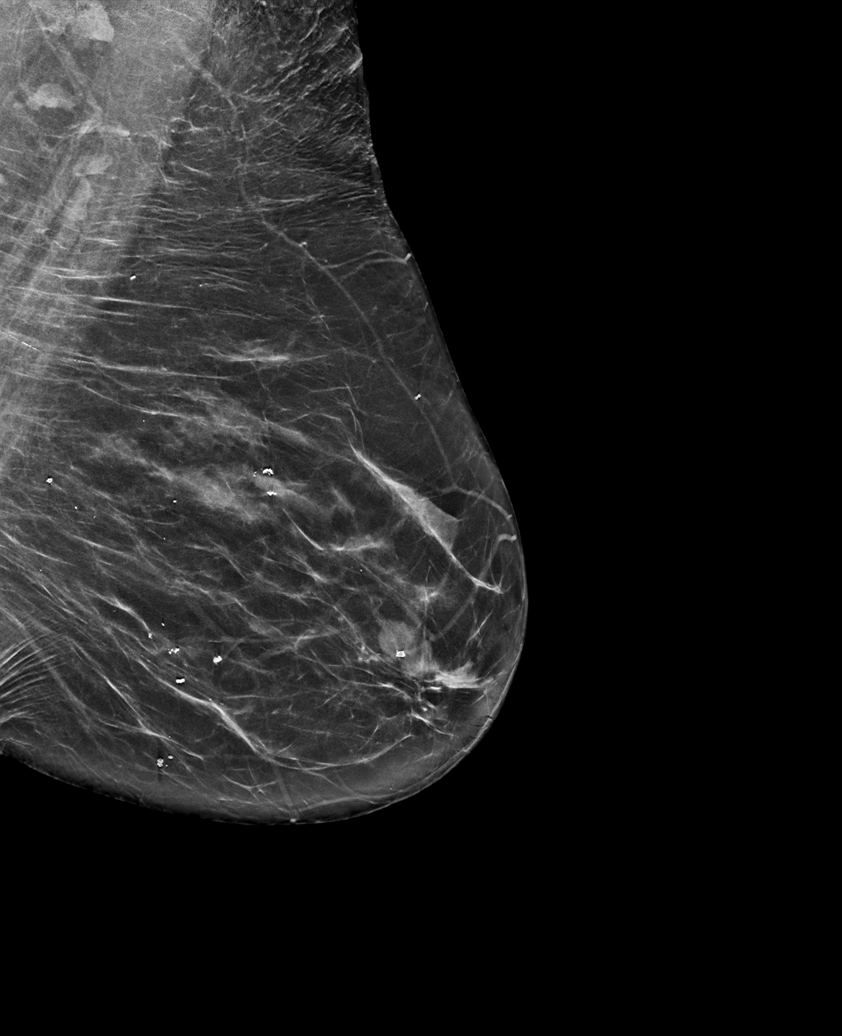

[R CC synth-2D]
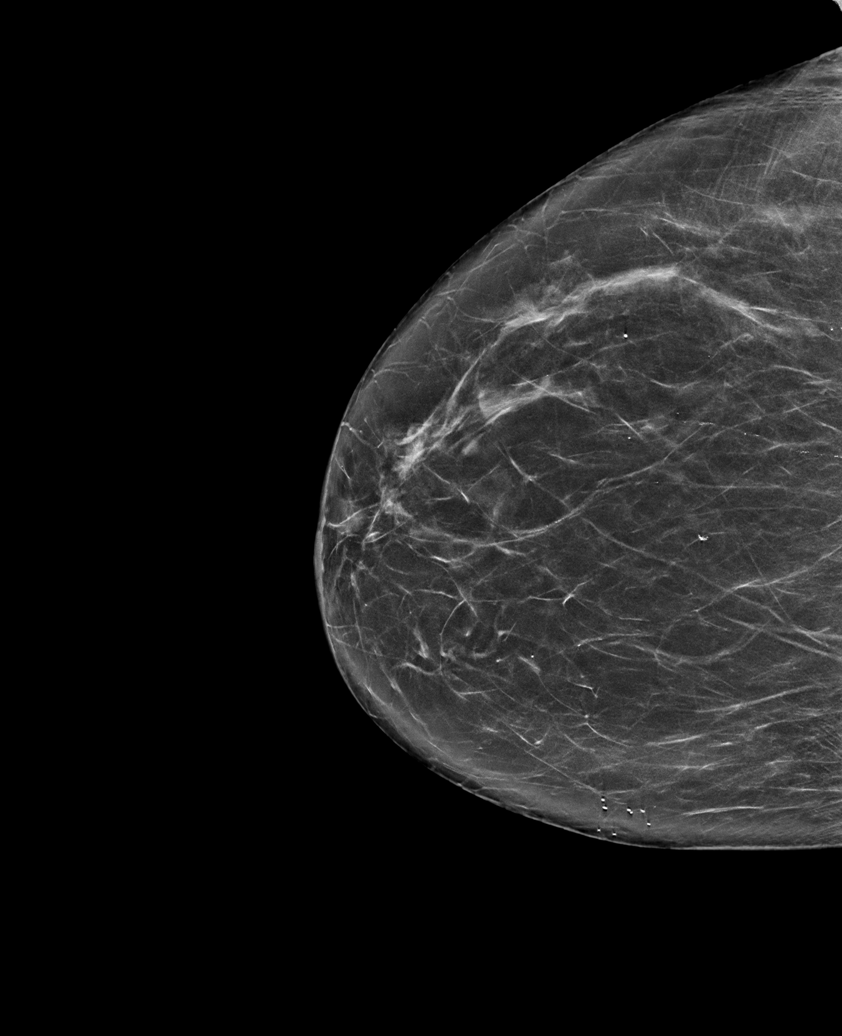

[R CC tomo · tomo slice 36/71.0]
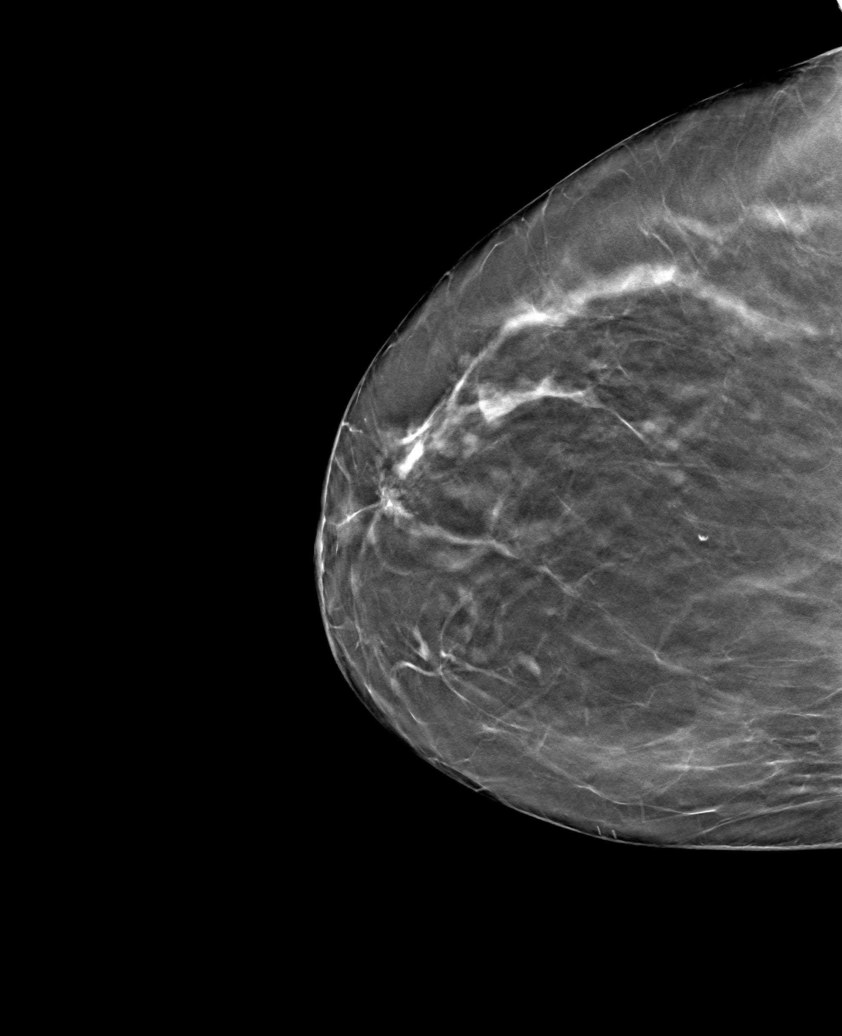

[6 of 30 positions shown; findings below may reference images not displayed]

ACR Breast Density Category b: There are scattered areas of
fibroglandular density.
FINDINGS: There are no findings suspicious for malignancy. Images were
processed with CAD.
IMPRESSION: No mammographic evidence of malignancy. A result letter of this
screening mammogram will be mailed directly to the patient.

RECOMMENDATION:
Screening mammogram in one year. (Code:[id number])

BI-RADS CATEGORY  1: Negative.

## 2021-03-02 ENCOUNTER — Encounter: Payer: Self-pay | Admitting: Gastroenterology
